# Patient Record
Sex: Female | Born: 1980 | Race: Black or African American | Hispanic: No | Marital: Single | Smoking: Never smoker
Health system: Southern US, Community
[De-identification: ages and names within clinical notes are randomized; demographics above are authoritative.]

## PROBLEM LIST (undated history)

## (undated) DIAGNOSIS — E079 Disorder of thyroid, unspecified: Secondary | ICD-10-CM

## (undated) DIAGNOSIS — K219 Gastro-esophageal reflux disease without esophagitis: Secondary | ICD-10-CM

## (undated) DIAGNOSIS — E0590 Thyrotoxicosis, unspecified without thyrotoxic crisis or storm: Secondary | ICD-10-CM

## (undated) HISTORY — PX: WISDOM TOOTH EXTRACTION: SHX21

---

## 1998-06-13 ENCOUNTER — Other Ambulatory Visit: Admission: RE | Admit: 1998-06-13 | Discharge: 1998-06-13 | Payer: Self-pay | Admitting: Obstetrics

## 2001-01-27 HISTORY — PX: TUBAL LIGATION: SHX77

## 2009-06-24 ENCOUNTER — Emergency Department (HOSPITAL_BASED_OUTPATIENT_CLINIC_OR_DEPARTMENT_OTHER): Admission: EM | Admit: 2009-06-24 | Discharge: 2009-06-24 | Payer: Self-pay | Admitting: Emergency Medicine

## 2009-06-24 ENCOUNTER — Ambulatory Visit: Payer: Self-pay | Admitting: Diagnostic Radiology

## 2012-12-17 ENCOUNTER — Emergency Department (HOSPITAL_BASED_OUTPATIENT_CLINIC_OR_DEPARTMENT_OTHER)
Admission: EM | Admit: 2012-12-17 | Discharge: 2012-12-17 | Disposition: A | Discharge status: Home / Self Care | Payer: Self-pay | Attending: Emergency Medicine | Admitting: Emergency Medicine

## 2012-12-17 ENCOUNTER — Encounter (HOSPITAL_BASED_OUTPATIENT_CLINIC_OR_DEPARTMENT_OTHER): Payer: Self-pay | Admitting: *Deleted

## 2012-12-17 DIAGNOSIS — Z3202 Encounter for pregnancy test, result negative: Secondary | ICD-10-CM | POA: Insufficient documentation

## 2012-12-17 DIAGNOSIS — Z8639 Personal history of other endocrine, nutritional and metabolic disease: Secondary | ICD-10-CM | POA: Insufficient documentation

## 2012-12-17 DIAGNOSIS — N760 Acute vaginitis: Secondary | ICD-10-CM | POA: Insufficient documentation

## 2012-12-17 DIAGNOSIS — Z862 Personal history of diseases of the blood and blood-forming organs and certain disorders involving the immune mechanism: Secondary | ICD-10-CM | POA: Insufficient documentation

## 2012-12-17 HISTORY — DX: Disorder of thyroid, unspecified: E07.9

## 2012-12-17 LAB — URINALYSIS, ROUTINE W REFLEX MICROSCOPIC
Ketones, ur: 15 mg/dL — AB
Leukocytes, UA: NEGATIVE
Nitrite: NEGATIVE
Protein, ur: NEGATIVE mg/dL

## 2012-12-17 LAB — WET PREP, GENITAL

## 2012-12-17 MED ORDER — CEFTRIAXONE SODIUM 250 MG IJ SOLR
250.0000 mg | Freq: Once | INTRAMUSCULAR | Status: AC
  Administered 2012-12-17: 250 mg via INTRAMUSCULAR
  Filled 2012-12-17: qty 250

## 2012-12-17 MED ORDER — LIDOCAINE HCL (PF) 1 % IJ SOLN
INTRAMUSCULAR | Status: AC
  Administered 2012-12-17: 2.1 mL
  Filled 2012-12-17: qty 5

## 2012-12-17 MED ORDER — METRONIDAZOLE 500 MG PO TABS: 500.0000 mg | ORAL_TABLET | Freq: Two times a day (BID) | ORAL | Status: DC

## 2012-12-17 MED ORDER — AZITHROMYCIN 250 MG PO TABS
1000.0000 mg | ORAL_TABLET | Freq: Once | ORAL | Status: AC
  Administered 2012-12-17: 1000 mg via ORAL
  Filled 2012-12-17: qty 4

## 2012-12-17 NOTE — ED Provider Notes (Signed)
History     CSN: [id number]  Arrival date & time 12/17/12  1417   First MD Initiated Contact with Patient 12/17/12 1548      Chief Complaint  Patient presents with  . Exposure to STD    (Consider location/radiation/quality/duration/timing/severity/associated sxs/prior treatment) HPI Comments: Patient presents today requesting to be checked for STD's.  She is sexually active.  Her boyfriend was recently diagnosed with Epididymitis and was treated for STD's.  She denies any symptoms at this time.  No vaginal discharge. No pelvic pain.  No fever or chills.   No prior history of STD's.    Patient is a 32 y.o. female presenting with STD exposure. The history is provided by the patient.  Exposure to STD This is a new problem. Pertinent negatives include no abdominal pain, chills, fever, nausea, rash, urinary symptoms or vomiting.    Past Medical History  Diagnosis Date  . Thyroid disease     Past Surgical History  Procedure Date  . Tubal ligation     History reviewed. No pertinent family history.  History  Substance Use Topics  . Smoking status: Never Smoker   . Smokeless tobacco: Not on file  . Alcohol Use: No    OB History    Grav Para Term Preterm Abortions TAB SAB Ect Mult Living                  Review of Systems  Constitutional: Negative for fever and chills.  Gastrointestinal: Negative for nausea, vomiting and abdominal pain.  Genitourinary: Negative for dysuria, urgency, frequency, vaginal bleeding and vaginal discharge.  Skin: Negative for rash.  All other systems reviewed and are negative.    Allergies  Milk-related compounds and Watermelon flavor  Home Medications  No current outpatient prescriptions on file.  BP 138/70  Pulse 80  Temp 98.1 F (36.7 C) (Oral)  Resp 20  Ht 5\' 5"  (1.651 m)  Wt 172 lb (78.019 kg)  BMI 28.62 kg/m2  SpO2 99%  LMP 11/23/2012  Physical Exam  Nursing note and vitals reviewed. Constitutional: She appears  well-developed and well-nourished. No distress.  HENT:  Head: Normocephalic and atraumatic.  Neck: Normal range of motion. Neck supple.  Cardiovascular: Normal rate, regular rhythm and normal heart sounds.   Pulmonary/Chest: Effort normal and breath sounds normal.  Abdominal: Soft. Bowel sounds are normal. She exhibits no distension and no mass. There is no tenderness. There is no rebound and no guarding.  Genitourinary: There is no rash, tenderness or lesion on the right labia. There is no rash, tenderness or lesion on the left labia. Cervix exhibits no motion tenderness. Right adnexum displays no mass, no tenderness and no fullness. Left adnexum displays no mass, no tenderness and no fullness.  Neurological: She is alert.  Skin: Skin is warm and dry. She is not diaphoretic.  Psychiatric: She has a normal mood and affect.    ED Course  Procedures (including critical care time)  Labs Reviewed  URINALYSIS, ROUTINE W REFLEX MICROSCOPIC - Abnormal; Notable for the following:    Bilirubin Urine SMALL (*)     Ketones, ur 15 (*)     Urobilinogen, UA 2.0 (*)     All other components within normal limits  PREGNANCY, URINE  GC/CHLAMYDIA PROBE AMP  WET PREP, GENITAL   No results found.   No diagnosis found.    MDM  Patient to be discharged with instructions to follow up with OBGYN. Discussed importance of using protection when sexually  active. Pt understands that they have GC/Chlamydia cultures pending and that they will need to inform all sexual partners if results return positive. Pt has been treated prophylacticly with azithromycin and rocephin due to pts history and at the patient's request. Pt not concerning for PID because hemodynamically stable and no cervical motion tenderness on pelvic exam. Pt has also been treated with flagyl for Bacterial Vaginosis. Pt has been advised to not drink alcohol while on this medication.         Pascal Lux [place], PA-C 12/17/12 2156

## 2012-12-17 NOTE — ED Notes (Signed)
Pt states she may have been exposed to an STD. Boyfriend being seen currently for ? Same. Pt has no s/s.

## 2012-12-20 ENCOUNTER — Emergency Department (HOSPITAL_BASED_OUTPATIENT_CLINIC_OR_DEPARTMENT_OTHER)
Admission: EM | Admit: 2012-12-20 | Discharge: 2012-12-20 | Disposition: A | Discharge status: Home / Self Care | Payer: Self-pay | Attending: Emergency Medicine | Admitting: Emergency Medicine

## 2012-12-20 ENCOUNTER — Encounter (HOSPITAL_BASED_OUTPATIENT_CLINIC_OR_DEPARTMENT_OTHER): Payer: Self-pay | Admitting: Emergency Medicine

## 2012-12-20 DIAGNOSIS — N390 Urinary tract infection, site not specified: Secondary | ICD-10-CM | POA: Insufficient documentation

## 2012-12-20 DIAGNOSIS — Z9851 Tubal ligation status: Secondary | ICD-10-CM | POA: Insufficient documentation

## 2012-12-20 DIAGNOSIS — Z79899 Other long term (current) drug therapy: Secondary | ICD-10-CM | POA: Insufficient documentation

## 2012-12-20 DIAGNOSIS — K5289 Other specified noninfective gastroenteritis and colitis: Secondary | ICD-10-CM | POA: Insufficient documentation

## 2012-12-20 DIAGNOSIS — Z3202 Encounter for pregnancy test, result negative: Secondary | ICD-10-CM | POA: Insufficient documentation

## 2012-12-20 DIAGNOSIS — K529 Noninfective gastroenteritis and colitis, unspecified: Secondary | ICD-10-CM

## 2012-12-20 DIAGNOSIS — E079 Disorder of thyroid, unspecified: Secondary | ICD-10-CM | POA: Insufficient documentation

## 2012-12-20 DIAGNOSIS — R197 Diarrhea, unspecified: Secondary | ICD-10-CM | POA: Insufficient documentation

## 2012-12-20 LAB — BASIC METABOLIC PANEL
CO2: 25 mEq/L (ref 19–32)
Chloride: 103 mEq/L (ref 96–112)
Creatinine, Ser: 0.5 mg/dL (ref 0.50–1.10)
GFR calc Af Amer: >90 mL/min (ref >90)
Potassium: 3.9 mEq/L (ref 3.5–5.1)

## 2012-12-20 LAB — CBC WITH DIFFERENTIAL/PLATELET
Basophils Absolute: 0 10*3/uL (ref 0.0–0.1)
Basophils Relative: 0 % (ref 0–1)
HCT: 42.2 % (ref 36.0–46.0)
Hemoglobin: 14.8 g/dL (ref 12.0–15.0)
Lymphocytes Relative: 8 % — ABNORMAL LOW (ref 12–46)
Monocytes Absolute: 0.8 10*3/uL (ref 0.1–1.0)
Monocytes Relative: 9 % (ref 3–12)
Neutro Abs: 8.1 10*3/uL — ABNORMAL HIGH (ref 1.7–7.7)
Neutrophils Relative %: 83 % — ABNORMAL HIGH (ref 43–77)
RDW: 14 % (ref 11.5–15.5)
WBC: 9.8 10*3/uL (ref 4.0–10.5)

## 2012-12-20 LAB — URINE MICROSCOPIC-ADD ON

## 2012-12-20 LAB — URINALYSIS, ROUTINE W REFLEX MICROSCOPIC
Glucose, UA: NEGATIVE mg/dL
Ketones, ur: >80 mg/dL — AB
Leukocytes, UA: NEGATIVE
Nitrite: NEGATIVE
Specific Gravity, Urine: 1.03 (ref 1.005–1.030)
pH: 6 (ref 5.0–8.0)

## 2012-12-20 LAB — PREGNANCY, URINE: Preg Test, Ur: NEGATIVE

## 2012-12-20 MED ORDER — SODIUM CHLORIDE 0.9 % IV BOLUS (SEPSIS)
1000.0000 mL | Freq: Once | INTRAVENOUS | Status: AC
  Administered 2012-12-20: 1000 mL via INTRAVENOUS

## 2012-12-20 MED ORDER — DEXTROSE 5 % IV SOLN
1.0000 g | INTRAVENOUS | Status: DC
  Administered 2012-12-20: 1 g via INTRAVENOUS
  Filled 2012-12-20: qty 10

## 2012-12-20 MED ORDER — METOCLOPRAMIDE HCL 10 MG PO TABS: 10.0000 mg | ORAL_TABLET | Freq: Four times a day (QID) | ORAL | Status: DC | PRN

## 2012-12-20 MED ORDER — ONDANSETRON HCL 4 MG/2ML IJ SOLN
4.0000 mg | Freq: Once | INTRAMUSCULAR | Status: AC
  Administered 2012-12-20: 4 mg via INTRAVENOUS
  Filled 2012-12-20: qty 2

## 2012-12-20 MED ORDER — CEPHALEXIN 500 MG PO CAPS: 500.0000 mg | ORAL_CAPSULE | Freq: Four times a day (QID) | ORAL | Status: DC

## 2012-12-20 MED ORDER — LOPERAMIDE HCL 2 MG PO CAPS
4.0000 mg | ORAL_CAPSULE | Freq: Once | ORAL | Status: AC
  Administered 2012-12-20: 4 mg via ORAL
  Filled 2012-12-20: qty 2

## 2012-12-20 NOTE — ED Notes (Signed)
N/V x "20" since last night.  NP cough.  ? Fever.

## 2012-12-20 NOTE — ED Provider Notes (Signed)
History     CSN: [id number]  Arrival date & time 12/20/12  4098   First MD Initiated Contact with Patient 12/20/12 [phone]      Chief Complaint  Patient presents with  . Emesis  . Diarrhea    (Consider location/radiation/quality/duration/timing/severity/associated sxs/prior treatment) Patient is a 32 y.o. female presenting with vomiting and diarrhea. The history is provided by the patient.  Emesis  Associated symptoms include diarrhea.  Diarrhea The primary symptoms include vomiting and diarrhea.  She had onset last night of nausea, vomiting, diarrhea. Nausea comes in waves. She has chills and sweats when she vomits, but not at other times. She denies fever. She does undergo she's had any sick contacts. She estimates that she is vomited 20 times. She continues to have nausea and a sense of impending diarrhea.  Past Medical History  Diagnosis Date  . Thyroid disease     Past Surgical History  Procedure Date  . Tubal ligation     No family history on file.  History  Substance Use Topics  . Smoking status: Never Smoker   . Smokeless tobacco: Not on file  . Alcohol Use: No    OB History    Grav Para Term Preterm Abortions TAB SAB Ect Mult Living                  Review of Systems  Gastrointestinal: Positive for vomiting and diarrhea.  All other systems reviewed and are negative.    Allergies  Milk-related compounds and Watermelon flavor  Home Medications   Current Outpatient Rx  Name  Route  Sig  Dispense  Refill  . METRONIDAZOLE 500 MG PO TABS   Oral   Take 1 tablet (500 mg total) by mouth 2 (two) times daily.   14 tablet   0     BP 139/84  Pulse 85  Temp 98.1 F (36.7 C) (Oral)  Resp 16  Ht 5\' 1"  (1.549 m)  Wt 172 lb (78.019 kg)  BMI 32.50 kg/m2  SpO2 100%  LMP 11/29/2012  Physical Exam  Nursing note and vitals reviewed. 32 year old female, resting comfortably and in no acute distress. Vital signs are significant for borderline hypertension  with blood pressure 139/84. Oxygen saturation is 100%, which is normal. Head is normocephalic and atraumatic. PERRLA, EOMI. Oropharynx is clear. Neck is nontender and supple without adenopathy or JVD. Back is nontender and there is no CVA tenderness. Lungs are clear without rales, wheezes, or rhonchi. Chest is nontender. Heart has regular rate and rhythm without murmur. Abdomen is soft, flat, nontender without masses or hepatosplenomegaly and peristalsis is hypoactive. Extremities have no cyanosis or edema, full range of motion is present. Skin is warm and dry without rash. Neurologic: Mental status is normal, cranial nerves are intact, there are no motor or sensory deficits.   ED Course  Procedures (including critical care time)  Results for orders placed during the hospital encounter of 12/20/12  CBC WITH DIFFERENTIAL      Component Value Range   WBC 9.8  4.0 - 10.5 K/uL   RBC 5.45 (*) 3.87 - 5.11 MIL/uL   Hemoglobin 14.8  12.0 - 15.0 g/dL   HCT 47.8  29.5 - 62.1 %   MCV 77.4 (*) 78.0 - 100.0 fL   MCH 27.2  26.0 - 34.0 pg   MCHC 35.1  30.0 - 36.0 g/dL   RDW 30.8  65.7 - 84.6 %   Platelets 266  150 - 400 K/uL  Neutrophils Relative 83 (*) 43 - 77 %   Neutro Abs 8.1 (*) 1.7 - 7.7 K/uL   Lymphocytes Relative 8 (*) 12 - 46 %   Lymphs Abs 0.8  0.7 - 4.0 K/uL   Monocytes Relative 9  3 - 12 %   Monocytes Absolute 0.8  0.1 - 1.0 K/uL   Eosinophils Relative 0  0 - 5 %   Eosinophils Absolute 0.0  0.0 - 0.7 K/uL   Basophils Relative 0  0 - 1 %   Basophils Absolute 0.0  0.0 - 0.1 K/uL  BASIC METABOLIC PANEL      Component Value Range   Sodium 141  135 - 145 mEq/L   Potassium 3.9  3.5 - 5.1 mEq/L   Chloride 103  96 - 112 mEq/L   CO2 25  19 - 32 mEq/L   Glucose, Bld 132 (*) 70 - 99 mg/dL   BUN 17  6 - 23 mg/dL   Creatinine, Ser 4.09  0.50 - 1.10 mg/dL   Calcium 81.1  8.4 - 91.4 mg/dL   GFR calc non Af Amer >90  >90 mL/min   GFR calc Af Amer >90  >90 mL/min  URINALYSIS, ROUTINE W  REFLEX MICROSCOPIC      Component Value Range   Color, Urine AMBER (*) YELLOW   APPearance CLOUDY (*) CLEAR   Specific Gravity, Urine 1.030  1.005 - 1.030   pH 6.0  5.0 - 8.0   Glucose, UA NEGATIVE  NEGATIVE mg/dL   Hgb urine dipstick SMALL (*) NEGATIVE   Bilirubin Urine SMALL (*) NEGATIVE   Ketones, ur >80 (*) NEGATIVE mg/dL   Protein, ur 30 (*) NEGATIVE mg/dL   Urobilinogen, UA 1.0  0.0 - 1.0 mg/dL   Nitrite NEGATIVE  NEGATIVE   Leukocytes, UA NEGATIVE  NEGATIVE  PREGNANCY, URINE      Component Value Range   Preg Test, Ur NEGATIVE  NEGATIVE  URINE MICROSCOPIC-ADD ON      Component Value Range   Squamous Epithelial / LPF FEW (*) RARE   WBC, UA 0-2  <3 WBC/hpf   RBC / HPF 3-6  <3 RBC/hpf   Bacteria, UA MANY (*) RARE   Urine-Other MUCOUS PRESENT      1. Gastroenteritis   2. Urinary tract infection       MDM  Probable viral gastroenteritis she does show signs of dehydration he'll be given IV fluids. She'll be given ondansetron for nausea and loperamide for diarrhea.   She feels significantly better after above noted treatment. Urinalysis is significant for bacteria. She's given a dose of ceftriaxone and will be discharged with prescriptions for ondansetron and cephalexin and is told to use over-the-counter loperamide as needed for diarrhea.        Dione Booze, MD 12/20/12 [phone]

## 2012-12-21 LAB — URINE CULTURE
Colony Count: NO GROWTH
Culture: NO GROWTH

## 2012-12-21 NOTE — ED Provider Notes (Signed)
Medical screening examination/treatment/procedure(s) were performed by non-physician practitioner and as supervising physician I was immediately available for consultation/collaboration.   Rolan Bucco, MD 12/21/12 [phone]

## 2012-12-31 ENCOUNTER — Emergency Department (HOSPITAL_BASED_OUTPATIENT_CLINIC_OR_DEPARTMENT_OTHER)
Admission: EM | Admit: 2012-12-31 | Discharge: 2012-12-31 | Disposition: A | Discharge status: Home / Self Care | Payer: Self-pay | Attending: Emergency Medicine | Admitting: Emergency Medicine

## 2012-12-31 ENCOUNTER — Encounter (HOSPITAL_BASED_OUTPATIENT_CLINIC_OR_DEPARTMENT_OTHER): Payer: Self-pay

## 2012-12-31 DIAGNOSIS — Y9389 Activity, other specified: Secondary | ICD-10-CM | POA: Insufficient documentation

## 2012-12-31 DIAGNOSIS — Y9289 Other specified places as the place of occurrence of the external cause: Secondary | ICD-10-CM | POA: Insufficient documentation

## 2012-12-31 DIAGNOSIS — S39012A Strain of muscle, fascia and tendon of lower back, initial encounter: Secondary | ICD-10-CM

## 2012-12-31 DIAGNOSIS — Z79899 Other long term (current) drug therapy: Secondary | ICD-10-CM | POA: Insufficient documentation

## 2012-12-31 DIAGNOSIS — Z862 Personal history of diseases of the blood and blood-forming organs and certain disorders involving the immune mechanism: Secondary | ICD-10-CM | POA: Insufficient documentation

## 2012-12-31 DIAGNOSIS — S335XXA Sprain of ligaments of lumbar spine, initial encounter: Secondary | ICD-10-CM | POA: Insufficient documentation

## 2012-12-31 DIAGNOSIS — Z8639 Personal history of other endocrine, nutritional and metabolic disease: Secondary | ICD-10-CM | POA: Insufficient documentation

## 2012-12-31 MED ORDER — METHOCARBAMOL 500 MG PO TABS: 500.0000 mg | ORAL_TABLET | Freq: Two times a day (BID) | ORAL | Status: DC

## 2012-12-31 MED ORDER — TRAMADOL HCL 50 MG PO TABS
50.0000 mg | ORAL_TABLET | Freq: Once | ORAL | Status: AC
  Administered 2012-12-31: 50 mg via ORAL
  Filled 2012-12-31: qty 1

## 2012-12-31 MED ORDER — TRAMADOL HCL 50 MG PO TABS: 50.0000 mg | ORAL_TABLET | Freq: Four times a day (QID) | ORAL | Status: DC | PRN

## 2012-12-31 MED ORDER — IBUPROFEN 400 MG PO TABS
600.0000 mg | ORAL_TABLET | Freq: Once | ORAL | Status: AC
  Administered 2012-12-31: 600 mg via ORAL
  Filled 2012-12-31: qty 1

## 2012-12-31 MED ORDER — METHOCARBAMOL 500 MG PO TABS
500.0000 mg | ORAL_TABLET | Freq: Once | ORAL | Status: AC
  Administered 2012-12-31: 500 mg via ORAL
  Filled 2012-12-31: qty 1

## 2012-12-31 MED ORDER — IBUPROFEN 600 MG PO TABS: 600.0000 mg | ORAL_TABLET | Freq: Four times a day (QID) | ORAL | Status: DC | PRN

## 2012-12-31 NOTE — ED Notes (Signed)
MD at bedside. 
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
## 2013-03-25 ENCOUNTER — Emergency Department (HOSPITAL_BASED_OUTPATIENT_CLINIC_OR_DEPARTMENT_OTHER): Payer: Self-pay

## 2013-03-25 ENCOUNTER — Emergency Department (HOSPITAL_BASED_OUTPATIENT_CLINIC_OR_DEPARTMENT_OTHER)
Admission: EM | Admit: 2013-03-25 | Discharge: 2013-03-25 | Disposition: A | Discharge status: Home / Self Care | Payer: Self-pay | Attending: Emergency Medicine | Admitting: Emergency Medicine

## 2013-03-25 ENCOUNTER — Emergency Department (HOSPITAL_BASED_OUTPATIENT_CLINIC_OR_DEPARTMENT_OTHER): Admission: EM | Admit: 2013-03-25 | Discharge: 2013-03-25 | Discharge status: Against Medical Advice | Payer: Self-pay

## 2013-03-25 ENCOUNTER — Encounter (HOSPITAL_BASED_OUTPATIENT_CLINIC_OR_DEPARTMENT_OTHER): Payer: Self-pay | Admitting: *Deleted

## 2013-03-25 DIAGNOSIS — Z862 Personal history of diseases of the blood and blood-forming organs and certain disorders involving the immune mechanism: Secondary | ICD-10-CM | POA: Insufficient documentation

## 2013-03-25 DIAGNOSIS — R51 Headache: Secondary | ICD-10-CM | POA: Insufficient documentation

## 2013-03-25 DIAGNOSIS — Z3202 Encounter for pregnancy test, result negative: Secondary | ICD-10-CM | POA: Insufficient documentation

## 2013-03-25 DIAGNOSIS — Z8639 Personal history of other endocrine, nutritional and metabolic disease: Secondary | ICD-10-CM | POA: Insufficient documentation

## 2013-03-25 DIAGNOSIS — R110 Nausea: Secondary | ICD-10-CM | POA: Insufficient documentation

## 2013-03-25 LAB — URINALYSIS, ROUTINE W REFLEX MICROSCOPIC
Hgb urine dipstick: NEGATIVE
Nitrite: NEGATIVE
Protein, ur: NEGATIVE mg/dL
Specific Gravity, Urine: 1.025 (ref 1.005–1.030)
Urobilinogen, UA: 1 mg/dL (ref 0.0–1.0)

## 2013-03-25 LAB — CBC
Hemoglobin: 12 g/dL (ref 12.0–15.0)
RBC: 4.5 MIL/uL (ref 3.87–5.11)

## 2013-03-25 LAB — BASIC METABOLIC PANEL
CO2: 21 mEq/L (ref 19–32)
Glucose, Bld: 99 mg/dL (ref 70–99)
Potassium: 4.3 mEq/L (ref 3.5–5.1)
Sodium: 138 mEq/L (ref 135–145)

## 2013-03-25 LAB — PREGNANCY, URINE: Preg Test, Ur: NEGATIVE

## 2013-03-25 MED ORDER — SODIUM CHLORIDE 0.9 % IV BOLUS (SEPSIS)
1000.0000 mL | Freq: Once | INTRAVENOUS | Status: AC
  Administered 2013-03-25: 1000 mL via INTRAVENOUS

## 2013-03-25 MED ORDER — KETOROLAC TROMETHAMINE 30 MG/ML IJ SOLN
30.0000 mg | Freq: Once | INTRAMUSCULAR | Status: AC
  Administered 2013-03-25: 30 mg via INTRAVENOUS
  Filled 2013-03-25: qty 1

## 2013-03-25 MED ORDER — METOCLOPRAMIDE HCL 5 MG/ML IJ SOLN
10.0000 mg | Freq: Once | INTRAMUSCULAR | Status: AC
  Administered 2013-03-25: 10 mg via INTRAVENOUS
  Filled 2013-03-25: qty 2

## 2013-03-25 MED ORDER — ONDANSETRON HCL 4 MG PO TABS: 4.0000 mg | ORAL_TABLET | Freq: Four times a day (QID) | ORAL | Status: DC

## 2013-03-25 MED ORDER — DIPHENHYDRAMINE HCL 50 MG/ML IJ SOLN
25.0000 mg | Freq: Once | INTRAMUSCULAR | Status: AC
  Administered 2013-03-25: 25 mg via INTRAVENOUS
  Filled 2013-03-25: qty 1

## 2013-03-25 NOTE — ED Provider Notes (Signed)
History  This chart was scribed for Cassandra Chick, MD by Shari Heritage, ED Scribe. The patient was seen in room MH06/MH06. Patient's care was started at 1506.   CSN: [id number]  Arrival date & time 03/25/13  1425   First MD Initiated Contact with Patient 03/25/13 1506      Chief Complaint  Patient presents with  . Headache    Patient is a 32 y.o. female presenting with headaches. The history is provided by the patient. No language interpreter was used.  Headache Pain location:  Generalized Quality:  Unable to specify Radiates to:  Does not radiate Severity currently:  9/10 Severity at highest:  9/10 Onset quality:  Gradual Duration:  2 days Timing:  Constant Chronicity:  New Relieved by:  Nothing Ineffective treatments:  NSAIDs Associated symptoms: nausea   Associated symptoms: no abdominal pain, no back pain, no blurred vision, no congestion, no cough, no diarrhea, no dizziness, no fever, no hearing loss, no loss of balance, no neck pain, no neck stiffness, no numbness, no seizures, no tingling, no visual change, no vomiting and no weakness      HPI Comments: Cassandra Ellis is a 32 y.o. female who presents to the Emergency Department complaining of non-radiating, diffuse, moderate to severe, constant headache onset 2 days ago. There is associated nausea. Patient states that headache is worse while standing and sitting up. She rates headache as 9/10 currently. She denies leg swelling, chest pain, visual changes, shortness of breath, vomiting, fever, chills, neurological deficits or any other symptoms. She has taken OTC medicines at home with no relief. She denies history of headaches or migraines. There have been no recent long car or plane trips. Patient has no history of cardiac disease, diabetes or hypertension. There is no personal history of DVT/PE.   Past Medical History  Diagnosis Date  . Thyroid disease     Past Surgical History  Procedure Laterality Date  . Tubal  ligation      History reviewed. No pertinent family history.  History  Substance Use Topics  . Smoking status: Never Smoker   . Smokeless tobacco: Not on file  . Alcohol Use: No    OB History   Grav Para Term Preterm Abortions TAB SAB Ect Mult Living                  Review of Systems  Constitutional: Negative for fever.  HENT: Negative for hearing loss, congestion, neck pain and neck stiffness.   Eyes: Negative for blurred vision.  Respiratory: Negative for cough.   Gastrointestinal: Positive for nausea. Negative for vomiting, abdominal pain and diarrhea.  Musculoskeletal: Negative for back pain.  Neurological: Positive for headaches. Negative for dizziness, seizures, numbness and loss of balance.  All other systems reviewed and are negative.    Allergies  Milk-related compounds and Watermelon flavor  Home Medications   Current Outpatient Rx  Name  Route  Sig  Dispense  Refill  . cephALEXin (KEFLEX) 500 MG capsule   Oral   Take 1 capsule (500 mg total) by mouth 4 (four) times daily.   40 capsule   0   . ibuprofen (ADVIL,MOTRIN) 600 MG tablet   Oral   Take 1 tablet (600 mg total) by mouth every 6 (six) hours as needed for pain.   30 tablet   0   . methocarbamol (ROBAXIN) 500 MG tablet   Oral   Take 1 tablet (500 mg total) by mouth 2 (two) times daily.  20 tablet   0   . metoCLOPramide (REGLAN) 10 MG tablet   Oral   Take 1 tablet (10 mg total) by mouth every 6 (six) hours as needed (nausea).   30 tablet   0   . metroNIDAZOLE (FLAGYL) 500 MG tablet   Oral   Take 1 tablet (500 mg total) by mouth 2 (two) times daily.   14 tablet   0   . traMADol (ULTRAM) 50 MG tablet   Oral   Take 1 tablet (50 mg total) by mouth every 6 (six) hours as needed for pain.   15 tablet   0     Triage Vitals: BP 134/70  Pulse 90  Temp(Src) 98.4 F (36.9 C) (Oral)  Resp 18  Ht 5\' 1"  (1.549 m)  Wt 170 lb (77.111 kg)  BMI 32.14 kg/m2  SpO2 100%  LMP  02/22/2013  Physical Exam  Constitutional: She is oriented to person, place, and time. She appears well-developed and well-nourished. No distress.  Uncomfortable appearing.  HENT:  Head: Normocephalic and atraumatic.  Mouth/Throat: Oropharynx is clear and moist and mucous membranes are normal. Mucous membranes are not dry.  Eyes: Conjunctivae and EOM are normal. Pupils are equal, round, and reactive to light.  Neck: Normal range of motion. Neck supple.  No meningismus. No nuchal rigidity.  Cardiovascular: Normal rate, regular rhythm and normal heart sounds.   Pulmonary/Chest: Effort normal and breath sounds normal.  Musculoskeletal: Normal range of motion.  Neurological: She is alert and oriented to person, place, and time.  5/5 strength throughout. CN 2-12 tested and intact. Normal grip strength. Sensation intact.  Skin: Skin is warm and dry. No rash noted. She is not diaphoretic.    ED Course  Procedures (including critical care time) DIAGNOSTIC STUDIES: Oxygen Saturation is 100% on room air, normal by my interpretation.    COORDINATION OF CARE: 3:10 PM- Patient presents with 2-day history of diffuse headache with associated nausea. Denies prior history of headaches or migraines. No neurological deficits on exam. Will order head CT. Will give Reglan 10 mg, Benadryl 25 mg, Toradol 30 mg and 1000 ml IV fluids. Patient informed of current plan for treatment and evaluation and agrees with plan at this time.    Labs Reviewed  URINALYSIS, ROUTINE W REFLEX MICROSCOPIC - Abnormal; Notable for the following:    APPearance CLOUDY (*)    Ketones, ur >80 (*)    All other components within normal limits  CBC - Abnormal; Notable for the following:    HCT 34.2 (*)    MCV 76.0 (*)    All other components within normal limits  BASIC METABOLIC PANEL - Abnormal; Notable for the following:    Creatinine, Ser 0.40 (*)    All other components within normal limits  PREGNANCY, URINE    Ct Head  Wo Contrast  03/25/2013  *RADIOLOGY REPORT*  Clinical Data: Headaches.  CT HEAD WITHOUT CONTRAST  Technique:  Contiguous axial images were obtained from the base of the skull through the vertex without contrast.  Comparison: None.  Findings: There are small periventricular white matter lucencies in both frontal lobes, more on the right than the left.  There is no hemorrhage or acute infarction or mass lesion.  No atrophy. Osseous structures are normal.  IMPRESSION: Small nonspecific white matter lucencies in the frontal lobes. These may represent small vessel ischemic changes.  Does the patient have a history of migraines?  Does the patient have fever or other signs of  infection?   Original Report Authenticated By: Francene Boyers, M.D.      1. Headache       MDM  Pt presenting with c/o headache.  She has no fever, no meningismus, normal neurologic exam.  Headache relieved after meds in the ED.  Nonspecific CT scan findings in frontal lobes- these findings were discussed with the patient.  She is scheduled for MRI tomorrow to further evaluation.  Very low suspicion for meningitis or encephalitis during today's visit.       I personally performed the services described in this documentation, which was scribed in my presence. The recorded information has been reviewed and is accurate.    Cassandra Chick, MD 03/28/13 [phone]

## 2013-03-25 NOTE — ED Notes (Signed)
Informed RN and EMT Tech's at 1555  I need a urinalysis for room 6 before Cat Scan.

## 2013-03-25 NOTE — ED Notes (Signed)
Pt here with boyfriend that is also being seen. C/O H/A x 2 days. +nausea. Increased pressure when standing.

## 2013-03-27 ENCOUNTER — Ambulatory Visit (HOSPITAL_BASED_OUTPATIENT_CLINIC_OR_DEPARTMENT_OTHER)
Admission: RE | Admit: 2013-03-27 | Discharge: 2013-03-27 | Disposition: A | Discharge status: Home / Self Care | Payer: BC Managed Care – PPO | Source: Ambulatory Visit | Attending: Emergency Medicine | Admitting: Emergency Medicine

## 2013-03-27 DIAGNOSIS — R948 Abnormal results of function studies of other organs and systems: Secondary | ICD-10-CM | POA: Insufficient documentation

## 2013-03-27 DIAGNOSIS — R51 Headache: Secondary | ICD-10-CM | POA: Insufficient documentation

## 2013-03-27 MED ORDER — GADOBENATE DIMEGLUMINE 529 MG/ML IV SOLN
17.0000 mL | Freq: Once | INTRAVENOUS | Status: AC | PRN
  Administered 2013-03-27: 17 mL via INTRAVENOUS

## 2013-03-28 ENCOUNTER — Encounter (HOSPITAL_COMMUNITY): Payer: Self-pay

## 2013-03-28 ENCOUNTER — Observation Stay (HOSPITAL_COMMUNITY)
Admission: AD | Admit: 2013-03-28 | Discharge: 2013-03-30 | Disposition: A | Discharge status: Home / Self Care | Payer: BC Managed Care – PPO | Source: Ambulatory Visit | Attending: Internal Medicine | Admitting: Internal Medicine

## 2013-03-28 DIAGNOSIS — R7989 Other specified abnormal findings of blood chemistry: Secondary | ICD-10-CM

## 2013-03-28 DIAGNOSIS — E876 Hypokalemia: Secondary | ICD-10-CM | POA: Diagnosis present

## 2013-03-28 DIAGNOSIS — E0590 Thyrotoxicosis, unspecified without thyrotoxic crisis or storm: Secondary | ICD-10-CM

## 2013-03-28 DIAGNOSIS — R51 Headache: Principal | ICD-10-CM | POA: Insufficient documentation

## 2013-03-28 DIAGNOSIS — Z79899 Other long term (current) drug therapy: Secondary | ICD-10-CM | POA: Insufficient documentation

## 2013-03-28 DIAGNOSIS — H53459 Other localized visual field defect, unspecified eye: Secondary | ICD-10-CM | POA: Insufficient documentation

## 2013-03-28 DIAGNOSIS — R519 Headache, unspecified: Secondary | ICD-10-CM | POA: Diagnosis present

## 2013-03-28 DIAGNOSIS — R112 Nausea with vomiting, unspecified: Secondary | ICD-10-CM | POA: Diagnosis present

## 2013-03-28 LAB — CBC WITH DIFFERENTIAL/PLATELET
Basophils Relative: 0 % (ref 0–1)
Eosinophils Absolute: 0 10*3/uL (ref 0.0–0.7)
HCT: 38.9 % (ref 36.0–46.0)
Hemoglobin: 13.5 g/dL (ref 12.0–15.0)
MCH: 26.1 pg (ref 26.0–34.0)
MCHC: 34.7 g/dL (ref 30.0–36.0)
Monocytes Absolute: 0.7 10*3/uL (ref 0.1–1.0)
Monocytes Relative: 10 % (ref 3–12)

## 2013-03-28 LAB — COMPREHENSIVE METABOLIC PANEL
Albumin: 3.8 g/dL (ref 3.5–5.2)
Alkaline Phosphatase: 94 U/L (ref 39–117)
BUN: 14 mg/dL (ref 6–23)
Chloride: 99 mEq/L (ref 96–112)
GFR calc Af Amer: >90 mL/min (ref >90)
Glucose, Bld: 104 mg/dL — ABNORMAL HIGH (ref 70–99)
Potassium: 3.3 mEq/L — ABNORMAL LOW (ref 3.5–5.1)
Total Bilirubin: 0.9 mg/dL (ref 0.3–1.2)

## 2013-03-28 LAB — MAGNESIUM: Magnesium: 1.7 mg/dL (ref 1.5–2.5)

## 2013-03-28 LAB — PROTIME-INR: INR: 1.14 (ref 0.00–1.49)

## 2013-03-28 MED ORDER — TRAMADOL HCL 50 MG PO TABS: 50.0000 mg | ORAL_TABLET | Freq: Four times a day (QID) | ORAL | Status: DC | PRN

## 2013-03-28 MED ORDER — HYDROCODONE-ACETAMINOPHEN 5-325 MG PO TABS
1.0000 | ORAL_TABLET | ORAL | Status: DC | PRN
  Administered 2013-03-28: 2 via ORAL
  Filled 2013-03-28: qty 2

## 2013-03-28 MED ORDER — METOCLOPRAMIDE HCL 10 MG PO TABS: 10.0000 mg | ORAL_TABLET | Freq: Four times a day (QID) | ORAL | Status: DC | PRN

## 2013-03-28 MED ORDER — ONDANSETRON HCL 4 MG PO TABS
4.0000 mg | ORAL_TABLET | Freq: Four times a day (QID) | ORAL | Status: DC | PRN
  Administered 2013-03-28: 4 mg via ORAL
  Filled 2013-03-28: qty 1

## 2013-03-28 MED ORDER — ACETAMINOPHEN 325 MG PO TABS: 650.0000 mg | ORAL_TABLET | Freq: Four times a day (QID) | ORAL | Status: DC | PRN

## 2013-03-28 MED ORDER — ONDANSETRON HCL 4 MG/2ML IJ SOLN: 4.0000 mg | Freq: Four times a day (QID) | INTRAMUSCULAR | Status: DC | PRN

## 2013-03-28 MED ORDER — METHOCARBAMOL 500 MG PO TABS
500.0000 mg | ORAL_TABLET | Freq: Four times a day (QID) | ORAL | Status: DC | PRN
  Filled 2013-03-28: qty 1

## 2013-03-28 MED ORDER — MORPHINE SULFATE 2 MG/ML IJ SOLN
1.0000 mg | INTRAMUSCULAR | Status: DC | PRN
  Administered 2013-03-29: 1 mg via INTRAVENOUS
  Filled 2013-03-28: qty 1

## 2013-03-28 MED ORDER — ACETAMINOPHEN 650 MG RE SUPP: 650.0000 mg | Freq: Four times a day (QID) | RECTAL | Status: DC | PRN

## 2013-03-28 MED ORDER — DIPHENHYDRAMINE HCL 50 MG/ML IJ SOLN: 25.0000 mg | Freq: Four times a day (QID) | INTRAMUSCULAR | Status: DC | PRN

## 2013-03-28 MED ORDER — PROCHLORPERAZINE EDISYLATE 5 MG/ML IJ SOLN
10.0000 mg | Freq: Four times a day (QID) | INTRAMUSCULAR | Status: DC | PRN
  Filled 2013-03-28: qty 2

## 2013-03-28 MED ORDER — METHOCARBAMOL 500 MG PO TABS: 500.0000 mg | ORAL_TABLET | Freq: Two times a day (BID) | ORAL | Status: DC

## 2013-03-28 MED ORDER — SODIUM CHLORIDE 0.9 % IV SOLN
INTRAVENOUS | Status: DC
  Administered 2013-03-28: 75 mL/h via INTRAVENOUS

## 2013-03-28 NOTE — Progress Notes (Signed)
Rizatriptan taken by patient with motrin and Zofran and got relief from headache, patient c/o of pain at this time

## 2013-03-28 NOTE — H&P (Signed)
[facility] History and Physical  Cassandra Ellis WUJ:[id number] DOB: 05-24-1981 DOA: 03/28/2013  Referring physician: ED physician PCP: Jackie Plum, MD   Chief Complaint: Headache  HPI:  Pt is 32 yo female who presented to ED with main concern of progressively worsening generalized headache that initially started 2 days prior to admission, 10/10 in severity, constant, non radiating, associated with nausea and non bloody vomiting, not relieved with OTC medications, no specific aggravating factors. Pt denies similar events in the past, no history of migraines or other types of headaches, no visual changes, no fevers, chills, no neck stiffness. Pt also denies other associated symptoms. Pt denies chest pain or shortness of breath.   In ED, pt given morphine and tylenol, says only minimal relief. [facility] asked to admit for further evaluation.   Assessment and Plan:  Principal Problem:   Generalized headache - unclear etiology as pt has no similar events in the past, MRI of the brain with no specific acute findings to explain headache - differential is wide as noted on MRI below, LP may be needed but will see with neurologist  - spoke with neurologist on call, recommendation is to avoid tylenol, Vicodin, initiate Compazine and Benadryl IV Q6 hours recommended - neurology will see in consultation, non emergent consult  - will check UDS, TSH Active Problems:   Nausea and vomiting - possibly associated and related to headaches, will provide Zofran for symptomatic relief of symptoms   Hypokalemia - secondary to vomiting - will supplement, BMP in AM  Code Status: Full Family Communication: Pt and family at bedside Disposition Plan: admit to medical floor   Review of Systems:  Constitutional: Negative for fever, chills and malaise/fatigue. Negative for diaphoresis.  HENT: Negative for hearing loss, ear pain, nosebleeds, congestion, sore throat, neck pain, tinnitus and ear discharge.    Eyes: Negative for blurred vision, double vision, photophobia, pain, discharge and redness.  Respiratory: Negative for cough, hemoptysis, sputum production, shortness of breath, wheezing and stridor.   Cardiovascular: Negative for chest pain, palpitations, orthopnea, claudication and leg swelling.  Gastrointestinal: Negative for abdominal pain. Negative for heartburn, constipation, blood in stool and melena.  Genitourinary: Negative for dysuria, urgency, frequency, hematuria and flank pain.  Musculoskeletal: Negative for myalgias, back pain, joint pain and falls.  Skin: Negative for itching and rash.  Neurological: Negative for tingling, tremors, sensory change, speech change, focal weakness, loss of consciousness. Endo/Heme/Allergies: Negative for environmental allergies and polydipsia. Does not bruise/bleed easily.  Psychiatric/Behavioral: Negative for suicidal ideas. The patient is not nervous/anxious.     Past Medical History  Diagnosis Date  . Thyroid disease     Past Surgical History  Procedure Laterality Date  . Tubal ligation     Social History:  reports that she has never smoked. She has never used smokeless tobacco. She reports that she does not drink alcohol or use illicit drugs.  Allergies  Allergen Reactions  . Milk-Related Compounds Other (See Comments)    Abd cramping and gas  . Watermelon Flavor Hives    Family history of HTN  Prior to Admission medications   Medication Sig Start Date End Date Taking? Authorizing Provider  ondansetron (ZOFRAN) 4 MG tablet Take 1 tablet (4 mg total) by mouth every 6 (six) hours. 03/25/13  Yes Ethelda Chick, MD    Physical Exam: Filed Vitals:   03/28/13 1454  BP: 129/80  Pulse: 84  Temp: 98 F (36.7 C)  TempSrc: Oral  Resp: 18  Height: 5\' 1"  (1.549  m)  Weight: 71.079 kg (156 lb 11.2 oz)  SpO2: 95%    Physical Exam  Constitutional: Appears well-developed and well-nourished. No distress.  HENT: Normocephalic.  External right and left ear normal. Oropharynx is clear and moist.  Eyes: Conjunctivae and EOM are normal. PERRLA, no scleral icterus.  Neck: Normal ROM. Neck supple. No JVD. No tracheal deviation. No thyromegaly.  CVS: RRR, S1/S2 +, no murmurs, no gallops, no carotid bruit.  Pulmonary: Effort and breath sounds normal, no stridor, rhonchi, wheezes, rales.  Abdominal: Soft. BS +,  no distension, tenderness, rebound or guarding.  Musculoskeletal: Normal range of motion. No edema and no tenderness.  Lymphadenopathy: No lymphadenopathy noted, cervical, inguinal. Neuro: Alert. Normal reflexes, muscle tone coordination. No cranial nerve deficit. Skin: Skin is warm and dry. No rash noted. Not diaphoretic. No erythema. No pallor.  Psychiatric: Normal mood and affect. Behavior, judgment, thought content normal.   Labs on Admission:  Basic Metabolic Panel:  Recent Labs Lab 03/25/13 1741 03/28/13 1512  NA 138 137  K 4.3 3.3*  CL 107 99  CO2 21 21  GLUCOSE 99 104*  BUN 10 14  CREATININE 0.40* 0.34*  CALCIUM 9.0 10.1  MG  --  1.7  PHOS  --  3.1   Liver Function Tests:  Recent Labs Lab 03/28/13 1512  AST 16  ALT 25  ALKPHOS 94  BILITOT 0.9  PROT 7.3  ALBUMIN 3.8   CBC:  Recent Labs Lab 03/25/13 1741 03/28/13 1512  WBC 6.8 7.4  NEUTROABS  --  5.0  HGB 12.0 13.5  HCT 34.2* 38.9  MCV 76.0* 75.2*  PLT 254 313   Radiological Exams on Admission: Mr Laqueta Jean WU Contrast  03/27/2013  *RADIOLOGY REPORT*  Clinical Data: Headaches.  Abnormal CT.  MRI HEAD WITHOUT AND WITH CONTRAST  Technique:  Multiplanar, multiecho pulse sequences of the brain and surrounding structures were obtained according to standard protocol without and with intravenous contrast  Contrast: 17mL MULTIHANCE GADOBENATE DIMEGLUMINE 529 MG/ML IV SOLN  Comparison: CT head 03/25/2013.  Findings: There is no evidence for acute infarction, intracranial hemorrhage, mass lesion, hydrocephalus, or extra-axial fluid. There  is no evidence for cerebral or cerebellar atrophy, nor is there a large vessel infarct.  No foci of chronic hemorrhage are seen.  Small area of decreased T2 signal on gradient sequence along the undersurface of the temporal lobe on the right felt to lie within the tentorium.  On FLAIR sequence, there are numerous bilateral symmetric foci of increased T2 signal corresponding to the areas of hypoattenuation on CT.  These do not display restricted diffusion or abnormal post contrast enhancement.  There are no definite similar lesions in the brainstem or cerebellum.  Some, but not most, are periventricular location; the majority are subcortical. Considerations include a demyelinating process such as multiple sclerosis, complicated migraine with bilateral subcortical ischemia, vasculitis, chronic infection, or previous bilateral watershed ischemic insults.  There are no areas of cortical brain substance loss identified. Correlate clinically.  The major intracranial vascular structures appear widely patent including the major dural venous sinuses.  The pituitary and cerebellar tonsils are unremarkable.  No cervical disease.  No osseous findings.  Mild nasopharyngeal adenoidal hypertrophy. Negative orbits, sinuses, and mastoids.  IMPRESSION: There are symmetric foci of increased T2 signal corresponding to areas of hypoattenuation on CT.  These do not display restricted diffusion or post contrast enhancement.  Their appearance is nonspecific, and the differential is wide.  See discussion above.   Original  Report Authenticated By: Davonna Belling, M.D.     EKG: Normal sinus rhythm, no ST/T wave changes  Debbora Presto, MD  [facility] Pager [phone]  If 7PM-7AM, please contact night-coverage [email or web address] Password [facility] 03/28/2013, 6:24 PM

## 2013-03-28 NOTE — Progress Notes (Signed)
Patient admitted to 5E, transferred from doctor's office, alert and oriented, transferred in wheelchair, tolerated well, BP => 129/80, P=> 84, temp => 98.7, SATs=> 95% on RA, patient voiding clear yellow urine, skin dry and intact, c/o HA with some nausea, family at bedside mother and aunt, patient direct admit, doctor notified of patient arrival to unit, patient in stable condition at this time

## 2013-03-28 NOTE — Consult Note (Addendum)
NEURO HOSPITALIST CONSULT NOTE    Reason for Consult:  Intractable headache   HPI:                                                                                                                                          Cassandra Ellis is an 32 y.o. female with a history of hypothyroidism but no history of severe headaches who presented with headache of 5 days duration. She described intensity as 10/10. Pain involves the entire head. She had scotomas and a pressure-like sensation in the back of her head and neck on standing or sitting. She has not been febrile. Neck is been stiff. MRI of her brain showed nonspecific T2 white matter changes, but was otherwise unremarkable. Patient was given a combination of Rizatriptan, ibuprofen and Zofran and had a good response. She was also started on Robaxin 500 mg every 12 hours. She's asymptomatic at this point with no headache. She was seen in the emergency room over the weekend and was given analgesic medication which relieved the headache transiently, but experienced rebound severe headache.   Past Medical History  Diagnosis Date  . Thyroid disease     Past Surgical History  Procedure Laterality Date  . Tubal ligation      History reviewed. No pertinent family history.   Social History:  reports that she has never smoked. She has never used smokeless tobacco. She reports that she does not drink alcohol or use illicit drugs.  Allergies  Allergen Reactions  . Milk-Related Compounds Other (See Comments)    Abd cramping and gas  . Watermelon Flavor Hives    MEDICATIONS:                                                                                                                     Scheduled:  AOZ:[id number], methocarbamol, metoCLOPramide, morphine injection, ondansetron (ZOFRAN) IV, ondansetron, prochlorperazine   ROS:  History obtained from the patient  General ROS: negative for - chills, fatigue, fever, night sweats, weight gain or weight loss Psychological ROS: negative for - behavioral disorder, hallucinations, memory difficulties, mood swings or suicidal ideation Ophthalmic ROS: negative for -  scotomas with severe headache.  ENT ROS: negative for - epistaxis, nasal discharge, oral lesions, sore throat, tinnitus or vertigo Allergy and Immunology ROS: negative for - hives or itchy/watery eyes Hematological and Lymphatic ROS: negative for - bleeding problems, bruising or swollen lymph nodes Endocrine ROS: negative for - galactorrhea, hair pattern changes, polydipsia/polyuria or temperature intolerance Respiratory ROS: negative for - cough, hemoptysis, shortness of breath or wheezing Cardiovascular ROS: negative for - chest pain, dyspnea on exertion, edema or irregular heartbeat Gastrointestinal ROS: negative for - abdominal pain, diarrhea, hematemesis, nausea/vomiting or stool incontinence Genito-Urinary ROS: negative for - dysuria, hematuria, incontinence or urinary frequency/urgency Musculoskeletal ROS: negative for - joint swelling or muscular weakness Neurological ROS: as noted in HPI Dermatological ROS: negative for rash and skin lesion changes   Blood pressure 129/80, pulse 84, temperature 98 F (36.7 C), temperature source Oral, resp. rate 18, height 5\' 1"  (1.549 m), weight 71.079 kg (156 lb 11.2 oz), last menstrual period 03/01/2013, SpO2 95.00%.   Neurologic Examination:                                                                                                      Mental Status: Alert, oriented, thought content appropriate.  Speech fluent without evidence of aphasia. Able to follow commands without difficulty. Cranial Nerves: II-Visual fields were normal. III/IV/VI-Pupils were equal and reacted. Extraocular movements were full and conjugate.     V/VII-no facial numbness and no facial weakness. VIII-normal. X-normal speech and symmetrical palatal movement. Motor: 5/5 bilaterally with normal tone and bulk Sensory: Normal throughout. Deep Tendon Reflexes: 2+ and symmetric. Plantars: mute bilaterally Cerebellar: Normal finger-to-nose testing. Meningeal signs: Negative  No results found for this basename: cbc, bmp, coags, chol, tri, ldl, hga1c    Results for orders placed during the hospital encounter of 03/28/13 (from the past 48 hour(s))  COMPREHENSIVE METABOLIC PANEL     Status: Abnormal   Collection Time    03/28/13  3:12 PM      Result Value Range   Sodium 137  135 - 145 mEq/L   Potassium 3.3 (*) 3.5 - 5.1 mEq/L   Chloride 99  96 - 112 mEq/L   CO2 21  19 - 32 mEq/L   Glucose, Bld 104 (*) 70 - 99 mg/dL   BUN 14  6 - 23 mg/dL   Creatinine, Ser 1.61 (*) 0.50 - 1.10 mg/dL   Calcium 09.6  8.4 - 04.5 mg/dL   Total Protein 7.3  6.0 - 8.3 g/dL   Albumin 3.8  3.5 - 5.2 g/dL   AST 16  0 - 37 U/L   ALT 25  0 - 35 U/L   Alkaline Phosphatase 94  39 - 117 U/L   Total Bilirubin 0.9  0.3 - 1.2 mg/dL   GFR calc non Af Amer >90  >90  mL/min   GFR calc Af Amer >90  >90 mL/min   Comment:            The eGFR has been calculated     using the CKD EPI equation.     This calculation has not been     validated in all clinical     situations.     eGFR's persistently     <90 mL/min signify     possible Chronic Kidney Disease.  MAGNESIUM     Status: None   Collection Time    03/28/13  3:12 PM      Result Value Range   Magnesium 1.7  1.5 - 2.5 mg/dL  PHOSPHORUS     Status: None   Collection Time    03/28/13  3:12 PM      Result Value Range   Phosphorus 3.1  2.3 - 4.6 mg/dL  CBC WITH DIFFERENTIAL     Status: Abnormal   Collection Time    03/28/13  3:12 PM      Result Value Range   WBC 7.4  4.0 - 10.5 K/uL   RBC 5.17 (*) 3.87 - 5.11 MIL/uL   Hemoglobin 13.5  12.0 - 15.0 g/dL   HCT 16.1  09.6 - 04.5 %   MCV 75.2 (*) 78.0 -  100.0 fL   MCH 26.1  26.0 - 34.0 pg   MCHC 34.7  30.0 - 36.0 g/dL   RDW 40.9  81.1 - 91.4 %   Platelets 313  150 - 400 K/uL   Neutrophils Relative 68  43 - 77 %   Neutro Abs 5.0  1.7 - 7.7 K/uL   Lymphocytes Relative 22  12 - 46 %   Lymphs Abs 1.6  0.7 - 4.0 K/uL   Monocytes Relative 10  3 - 12 %   Monocytes Absolute 0.7  0.1 - 1.0 K/uL   Eosinophils Relative 0  0 - 5 %   Eosinophils Absolute 0.0  0.0 - 0.7 K/uL   Basophils Relative 0  0 - 1 %   Basophils Absolute 0.0  0.0 - 0.1 K/uL  APTT     Status: None   Collection Time    03/28/13  3:12 PM      Result Value Range   aPTT 28  24 - 37 seconds  PROTIME-INR     Status: None   Collection Time    03/28/13  3:12 PM      Result Value Range   Prothrombin Time 14.4  11.6 - 15.2 seconds   INR 1.14  0.00 - 1.49    Mr Laqueta Jean Wo Contrast  03/27/2013  *RADIOLOGY REPORT*  Clinical Data: Headaches.  Abnormal CT.  MRI HEAD WITHOUT AND WITH CONTRAST  Technique:  Multiplanar, multiecho pulse sequences of the brain and surrounding structures were obtained according to standard protocol without and with intravenous contrast  Contrast: 17mL MULTIHANCE GADOBENATE DIMEGLUMINE 529 MG/ML IV SOLN  Comparison: CT head 03/25/2013.  Findings: There is no evidence for acute infarction, intracranial hemorrhage, mass lesion, hydrocephalus, or extra-axial fluid. There is no evidence for cerebral or cerebellar atrophy, nor is there a large vessel infarct.  No foci of chronic hemorrhage are seen.  Small area of decreased T2 signal on gradient sequence along the undersurface of the temporal lobe on the right felt to lie within the tentorium.  On FLAIR sequence, there are numerous bilateral symmetric foci of increased T2 signal corresponding to the areas of hypoattenuation on  CT.  These do not display restricted diffusion or abnormal post contrast enhancement.  There are no definite similar lesions in the brainstem or cerebellum.  Some, but not most, are periventricular  location; the majority are subcortical. Considerations include a demyelinating process such as multiple sclerosis, complicated migraine with bilateral subcortical ischemia, vasculitis, chronic infection, or previous bilateral watershed ischemic insults.  There are no areas of cortical brain substance loss identified. Correlate clinically.  The major intracranial vascular structures appear widely patent including the major dural venous sinuses.  The pituitary and cerebellar tonsils are unremarkable.  No cervical disease.  No osseous findings.  Mild nasopharyngeal adenoidal hypertrophy. Negative orbits, sinuses, and mastoids.  IMPRESSION: There are symmetric foci of increased T2 signal corresponding to areas of hypoattenuation on CT.  These do not display restricted diffusion or post contrast enhancement.  Their appearance is nonspecific, and the differential is wide.  See discussion above.   Original Report Authenticated By: Davonna Belling, M.D.      Assessment/Plan: Persistent severe headache with vascular features, of unclear etiology. Headache has resolved at this point. Patient has been given ibuprofen as well as Robaxin which hopefully will prevent rebound headache recurrence. MRI findings are nonspecific and likely unrelated to patient's presenting problem. No further neurodiagnostic studies are indicated at this point since patient has no clinical signs of meningitis.  Recommend no changes in current management. We will continue to follow this patient with you.   Venetia Maxon M.D. [facility] [phone]  03/28/2013, 8:33 PM

## 2013-03-29 DIAGNOSIS — R946 Abnormal results of thyroid function studies: Secondary | ICD-10-CM

## 2013-03-29 DIAGNOSIS — E0590 Thyrotoxicosis, unspecified without thyrotoxic crisis or storm: Secondary | ICD-10-CM

## 2013-03-29 LAB — COMPREHENSIVE METABOLIC PANEL
ALT: 20 U/L (ref 0–35)
Albumin: 3.2 g/dL — ABNORMAL LOW (ref 3.5–5.2)
Alkaline Phosphatase: 82 U/L (ref 39–117)
BUN: 12 mg/dL (ref 6–23)
Chloride: 103 mEq/L (ref 96–112)
Glucose, Bld: 101 mg/dL — ABNORMAL HIGH (ref 70–99)
Potassium: 3.5 mEq/L (ref 3.5–5.1)
Total Bilirubin: 0.8 mg/dL (ref 0.3–1.2)

## 2013-03-29 LAB — CBC
MCHC: 34.6 g/dL (ref 30.0–36.0)
MCV: 75.5 fL — ABNORMAL LOW (ref 78.0–100.0)
Platelets: 267 10*3/uL (ref 150–400)
RDW: 13.1 % (ref 11.5–15.5)
WBC: 8.3 10*3/uL (ref 4.0–10.5)

## 2013-03-29 MED ORDER — DIPHENHYDRAMINE HCL 50 MG/ML IJ SOLN
25.0000 mg | Freq: Four times a day (QID) | INTRAMUSCULAR | Status: DC | PRN
  Administered 2013-03-29: 25 mg via INTRAVENOUS
  Filled 2013-03-29: qty 1

## 2013-03-29 MED ORDER — ADULT MULTIVITAMIN W/MINERALS CH
1.0000 | ORAL_TABLET | Freq: Every day | ORAL | Status: DC
  Administered 2013-03-29 – 2013-03-30 (×2): 1 via ORAL
  Filled 2013-03-29 (×2): qty 1

## 2013-03-29 MED ORDER — VALPROATE SODIUM 500 MG/5ML IV SOLN
500.0000 mg | Freq: Once | INTRAVENOUS | Status: AC
  Administered 2013-03-29: 500 mg via INTRAVENOUS
  Filled 2013-03-29: qty 5

## 2013-03-29 MED ORDER — PROCHLORPERAZINE EDISYLATE 5 MG/ML IJ SOLN
10.0000 mg | Freq: Four times a day (QID) | INTRAMUSCULAR | Status: DC | PRN
  Administered 2013-03-29: 10 mg via INTRAVENOUS

## 2013-03-29 MED ORDER — ENSURE COMPLETE PO LIQD
237.0000 mL | Freq: Two times a day (BID) | ORAL | Status: DC
  Administered 2013-03-30: 237 mL via ORAL

## 2013-03-29 NOTE — Progress Notes (Signed)
INITIAL NUTRITION ASSESSMENT  DOCUMENTATION CODES Per approved criteria  -Not Applicable   INTERVENTION: Provide Ensure Complete BID in between meals Provide Multivitamin with minerals daily  NUTRITION DIAGNOSIS: Inadequate oral intake related to headaches and nausea as evidenced by 8% wt loss in less than one week and pt's report of eating very little for the past 5 days.   Goal: Pt to meet >/= 90% of their estimated nutrition needs  Monitor:  PO intake Wt  Reason for Assessment: MST  32 y.o. female  Admitting Dx: Generalized headache  ASSESSMENT: 32 yo female who presented to ED with main concern of progressively worsening generalized headache that initially started 2 days prior to admission, 10/10 in severity, constant, non radiating, associated with nausea and non bloody vomiting, not relieved with OTC medications, no specific aggravating factors. Pt reports that she has has very little to eat since Saturday due to headaches and nausea. Pt usually weighs 170 lbs. Nausea has improved today and pt ate breakfast but, no lunch today per pt report (as of 2 PM). Pt reports that she is lactose intolerant.   Height: Ht Readings from Last 1 Encounters:  03/28/13 5\' 1"  (1.549 m)    Weight: Wt Readings from Last 1 Encounters:  03/28/13 156 lb 11.2 oz (71.079 kg)    Ideal Body Weight: 105 lbs  % Ideal Body Weight: 149%  Wt Readings from Last 10 Encounters:  03/28/13 156 lb 11.2 oz (71.079 kg)  03/25/13 170 lb (77.111 kg)  12/31/12 168 lb (76.204 kg)  12/20/12 172 lb (78.019 kg)  12/17/12 172 lb (78.019 kg)    Usual Body Weight: 170 lbs  % Usual Body Weight: 92%  BMI:  Body mass index is 29.62 kg/(m^2).  Estimated Nutritional Needs: Kcal: 1775-1990 Protein: 71-85 grams Fluid: 2.2 L  Skin: WDL  Diet Order: General  EDUCATION NEEDS: -No education needs identified at this time   Intake/Output Summary (Last 24 hours) at 03/29/13 1525 Last data filed at  03/29/13 0400  Gross per 24 hour  Intake 956.25 ml  Output      0 ml  Net 956.25 ml    Last BM: 4/15  Labs:   Recent Labs Lab 03/25/13 1741 03/28/13 1512 03/29/13 0515  NA 138 137 138  K 4.3 3.3* 3.5  CL 107 99 103  CO2 21 21 25   BUN 10 14 12   CREATININE 0.40* 0.34* 0.40*  CALCIUM 9.0 10.1 9.3  MG  --  1.7  --   PHOS  --  3.1  --   GLUCOSE 99 104* 101*    CBG (last 3)  No results found for this basename: GLUCAP,  in the last 72 hours  Scheduled Meds:   Continuous Infusions: . sodium chloride 75 mL/hr (03/28/13 1515)    Past Medical History  Diagnosis Date  . Thyroid disease     Past Surgical History  Procedure Laterality Date  . Tubal ligation      Ian Malkin RD, LDN Inpatient Clinical Dietitian Pager: [phone] After Hours Pager: [phone]

## 2013-03-29 NOTE — Progress Notes (Signed)
[facility] PROGRESS NOTE  Cassandra Ellis ZOX:[id number] DOB: 07-15-1981 DOA: 03/28/2013 PCP: Jackie Plum, MD  Assessment/Plan: Principal Problem:  Headache  - unclear etiology as pt has no similar events in the past, possible migraine, MRI of the brain with no specific acute findings to explain headache  - appreciate neuro input pt given dose of depacon and to continue Compazine and Benadryl started last pm - recommendation is to avoid tylenol, Vicodin, initiate  - spoke with Dr Amada Jupiter today and he states that with the pt's suppressed TSH -  Possible hyperthyroidism is likely etiology of her headaches, and in that case he recommends home when medically ready on gabapentin with compazine /benadryl po, but not the medrol dose pack as stated in his note  Active Problems:  Nausea and vomiting  - possibly associated and related to headaches, will provide Zofran for symptomatic relief of symptoms  Hypokalemia  - secondary to vomiting  - resolved Suppressed TSH - has h/o thyroid disease, but on no listed meds -will obtain free T4 and T3 to further eval and follow   Code Status: full Family Communication: directly with pt at bedside Disposition Plan: to home when medically stable   Consultants:  neuro  Procedures:  none  Antibiotics:  none  HPI/Subjective: States still with headache this am, admits to photophobia and phonophobia  Objective: Filed Vitals:   03/28/13 1454 03/28/13 2125 03/29/13 0546 03/29/13 1321  BP: 129/80 138/80 107/64 111/72  Pulse: 84 86 82 87  Temp: 98 F (36.7 C) 98.2 F (36.8 C) 98.5 F (36.9 C) 98.8 F (37.1 C)  TempSrc: Oral Oral Oral Oral  Resp: 18 18 18 18   Height: 5\' 1"  (1.549 m)     Weight: 71.079 kg (156 lb 11.2 oz)     SpO2: 95% 97% 97% 98%    Intake/Output Summary (Last 24 hours) at 03/29/13 1623 Last data filed at 03/29/13 0400  Gross per 24 hour  Intake 956.25 ml  Output      0 ml  Net 956.25 ml   Filed  Weights   03/28/13 1454  Weight: 71.079 kg (156 lb 11.2 oz)    Exam:   General:  Young female in NAD, alert and oriented x3  Cardiovascular: RRR nl S1S2  Respiratory: CTAB  Abdomen: Soft +BS NT/ND  Extremities: No cyanosis and no edema  Data Reviewed: Basic Metabolic Panel:  Recent Labs Lab 03/25/13 1741 03/28/13 1512 03/29/13 0515  NA 138 137 138  K 4.3 3.3* 3.5  CL 107 99 103  CO2 21 21 25   GLUCOSE 99 104* 101*  BUN 10 14 12   CREATININE 0.40* 0.34* 0.40*  CALCIUM 9.0 10.1 9.3  MG  --  1.7  --   PHOS  --  3.1  --    Liver Function Tests:  Recent Labs Lab 03/28/13 1512 03/29/13 0515  AST 16 13  ALT 25 20  ALKPHOS 94 82  BILITOT 0.9 0.8  PROT 7.3 6.6  ALBUMIN 3.8 3.2*   No results found for this basename: LIPASE, AMYLASE,  in the last 168 hours No results found for this basename: AMMONIA,  in the last 168 hours CBC:  Recent Labs Lab 03/25/13 1741 03/28/13 1512 03/29/13 0515  WBC 6.8 7.4 8.3  NEUTROABS  --  5.0  --   HGB 12.0 13.5 12.6  HCT 34.2* 38.9 36.4  MCV 76.0* 75.2* 75.5*  PLT 254 313 267   Cardiac Enzymes: No results found for this basename: CKTOTAL, CKMB,  CKMBINDEX, TROPONINI,  in the last 168 hours BNP (last 3 results) No results found for this basename: PROBNP,  in the last 8760 hours CBG: No results found for this basename: GLUCAP,  in the last 168 hours  No results found for this or any previous visit (from the past 240 hour(s)).   Studies: Mr Laqueta Jean UJ Contrast  Apr 14, 2013  *RADIOLOGY REPORT*  Clinical Data: Headaches.  Abnormal CT.  MRI HEAD WITHOUT AND WITH CONTRAST  Technique:  Multiplanar, multiecho pulse sequences of the brain and surrounding structures were obtained according to standard protocol without and with intravenous contrast  Contrast: 17mL MULTIHANCE GADOBENATE DIMEGLUMINE 529 MG/ML IV SOLN  Comparison: CT head 03/25/2013.  Findings: There is no evidence for acute infarction, intracranial hemorrhage, mass  lesion, hydrocephalus, or extra-axial fluid. There is no evidence for cerebral or cerebellar atrophy, nor is there a large vessel infarct.  No foci of chronic hemorrhage are seen.  Small area of decreased T2 signal on gradient sequence along the undersurface of the temporal lobe on the right felt to lie within the tentorium.  On FLAIR sequence, there are numerous bilateral symmetric foci of increased T2 signal corresponding to the areas of hypoattenuation on CT.  These do not display restricted diffusion or abnormal post contrast enhancement.  There are no definite similar lesions in the brainstem or cerebellum.  Some, but not most, are periventricular location; the majority are subcortical. Considerations include a demyelinating process such as multiple sclerosis, complicated migraine with bilateral subcortical ischemia, vasculitis, chronic infection, or previous bilateral watershed ischemic insults.  There are no areas of cortical brain substance loss identified. Correlate clinically.  The major intracranial vascular structures appear widely patent including the major dural venous sinuses.  The pituitary and cerebellar tonsils are unremarkable.  No cervical disease.  No osseous findings.  Mild nasopharyngeal adenoidal hypertrophy. Negative orbits, sinuses, and mastoids.  IMPRESSION: There are symmetric foci of increased T2 signal corresponding to areas of hypoattenuation on CT.  These do not display restricted diffusion or post contrast enhancement.  Their appearance is nonspecific, and the differential is wide.  See discussion above.   Original Report Authenticated By: Davonna Belling, M.D.     Scheduled Meds: . feeding supplement  237 mL Oral BID BM  . multivitamin with minerals  1 tablet Oral Daily   Continuous Infusions: . sodium chloride 75 mL/hr (03/28/13 1515)    Principal Problem:   Generalized headache Active Problems:   Nausea and vomiting   Hypokalemia   Intractable headache    Time  spent:     [facility]  [facility] Pager [phone]. If 7PM-7AM, please contact night-coverage at [email or web address], password [facility] 03/29/2013, 4:23 PM  LOS: 1 day

## 2013-03-29 NOTE — Progress Notes (Addendum)
NEURO HOSPITALIST PROGRESS NOTE   SUBJECTIVE:                                                                                                                        Complaining of a 8/10 HA. States that the Vicodin and Morphine do take away the HA but once the medication wears off the HA will return.   OBJECTIVE:                                                                                                                           Vital signs in last 24 hours: Temp:  [98 F (36.7 C)-98.5 F (36.9 C)] 98.5 F (36.9 C) (04/17 0546) Pulse Rate:  [82-86] 82 (04/17 0546) Resp:  [18] 18 (04/17 0546) BP: (107-138)/(64-80) 107/64 mmHg (04/17 0546) SpO2:  [95 %-97 %] 97 % (04/17 0546) Weight:  [71.079 kg (156 lb 11.2 oz)] 71.079 kg (156 lb 11.2 oz) (04/16 1454)  Intake/Output from previous day: 04/16 0701 - 04/17 0700 In: 956.3 [I.V.:956.3] Out: -  Intake/Output this shift:   Nutritional status: General  Past Medical History  Diagnosis Date  . Thyroid disease      Neurologic Exam:  Mental Status: Alert, oriented, thought content appropriate.  Speech fluent without evidence of aphasia.  Able to follow 3 step commands without difficulty. Cranial Nerves: II: Discs flat bilaterally; Visual fields grossly normal, pupils equal, round, reactive to light and accommodation III,IV, VI: ptosis not present, extra-ocular motions intact bilaterally V,VII: smile symmetric, facial light touch sensation normal bilaterally VIII: hearing normal bilaterally IX,X: gag reflex present XI: bilateral shoulder shrug XII: midline tongue extension Motor: Right : Upper extremity   5/5    Left:     Upper extremity   5/5  Lower extremity   5/5     Lower extremity   5/5 Tone and bulk:normal tone throughout; no atrophy noted Sensory: Pinprick and light touch intact throughout, bilaterally Deep Tendon Reflexes: 2+ and symmetric throughout Plantars: Right:  downgoing   Left: downgoing Cerebellar: normal finger-to-nose,  normal heel-to-shin test  CV: pulses palpable throughout    Lab Results: No results found for this basename: cbc, bmp, coags, chol, tri, ldl, hga1c   Lipid Panel No results  found for this basename: CHOL, TRIG, HDL, CHOLHDL, VLDL, LDLCALC,  in the last 72 hours  Studies/Results: Mr Laqueta Jean Wo Contrast  03/27/2013  *RADIOLOGY REPORT*  Clinical Data: Headaches.  Abnormal CT.  MRI HEAD WITHOUT AND WITH CONTRAST  Technique:  Multiplanar, multiecho pulse sequences of the brain and surrounding structures were obtained according to standard protocol without and with intravenous contrast  Contrast: 17mL MULTIHANCE GADOBENATE DIMEGLUMINE 529 MG/ML IV SOLN  Comparison: CT head 03/25/2013.  Findings: There is no evidence for acute infarction, intracranial hemorrhage, mass lesion, hydrocephalus, or extra-axial fluid. There is no evidence for cerebral or cerebellar atrophy, nor is there a large vessel infarct.  No foci of chronic hemorrhage are seen.  Small area of decreased T2 signal on gradient sequence along the undersurface of the temporal lobe on the right felt to lie within the tentorium.  On FLAIR sequence, there are numerous bilateral symmetric foci of increased T2 signal corresponding to the areas of hypoattenuation on CT.  These do not display restricted diffusion or abnormal post contrast enhancement.  There are no definite similar lesions in the brainstem or cerebellum.  Some, but not most, are periventricular location; the majority are subcortical. Considerations include a demyelinating process such as multiple sclerosis, complicated migraine with bilateral subcortical ischemia, vasculitis, chronic infection, or previous bilateral watershed ischemic insults.  There are no areas of cortical brain substance loss identified. Correlate clinically.  The major intracranial vascular structures appear widely patent including the major dural venous  sinuses.  The pituitary and cerebellar tonsils are unremarkable.  No cervical disease.  No osseous findings.  Mild nasopharyngeal adenoidal hypertrophy. Negative orbits, sinuses, and mastoids.  IMPRESSION: There are symmetric foci of increased T2 signal corresponding to areas of hypoattenuation on CT.  These do not display restricted diffusion or post contrast enhancement.  Their appearance is nonspecific, and the differential is wide.  See discussion above.   Original Report Authenticated By: Davonna Belling, M.D.     MEDICATIONS                                                                                                                        Scheduled: . valproate sodium  500 mg Intravenous Once    ASSESSMENT/PLAN:                                                                                                             Persistent severe headache with vascular features, of unclear etiology. Headache has returned at 8/10 at this point but patient  is watching TV in room comfortably. Patient stating that the Vicodin and Morphine are helpful but when medication wears off HA returns. MRI findings are nonspecific and likely unrelated to patient's presenting problem. Recommend Compazine with Benadryl together every 6 hours as needed and will also give 500 mg Depakote IV once now over 15 minutes.  Patient may be discharged on the compazine and benadryl in oral form for HA.   Assessment and plan discussed with with attending physician and they are in agreement.    Felicie Morn PA-C [facility] [phone]  03/29/2013, 9:05 AM    I have seen and evaluated the patient. I have reviewed the above note and made appropriate changes. New onset throbbing headache with occasional scotoma. Her headache is currently completely resolved. Her fundi do not demonstrate any evidence of papilledema. MRI shows non-specific changes which in the appropriate setting could represent demyelinating disease, but do  not feel that she has this history. She is from [place], and could call to schedule a neurology appointment there.   In the interim, I would give a temporary course of steroids(medrol dose pack) and start gabapentin 300mg  TID. This can be done as an outpatient. She can take compazine 10mg  PO + benadryl 25mg  PO for breakthrough headache.   Ritta Slot, MD [facility] [phone]  If 7pm- 7am, please page neurology on call at [phone].

## 2013-03-30 MED ORDER — PROCHLORPERAZINE MALEATE 10 MG PO TABS: 10.0000 mg | ORAL_TABLET | Freq: Four times a day (QID) | ORAL | Status: DC | PRN

## 2013-03-30 MED ORDER — METHIMAZOLE 10 MG PO TABS: 40.0000 mg | ORAL_TABLET | Freq: Every day | ORAL | Status: DC

## 2013-03-30 MED ORDER — GABAPENTIN 300 MG PO CAPS: 300.0000 mg | ORAL_CAPSULE | Freq: Three times a day (TID) | ORAL | Status: DC

## 2013-03-30 MED ORDER — DIPHENHYDRAMINE HCL 25 MG PO CAPS: 25.0000 mg | ORAL_CAPSULE | Freq: Four times a day (QID) | ORAL | Status: DC | PRN

## 2013-03-30 NOTE — Progress Notes (Signed)
Discharge instructions given to pt, verbalized understanding. Left the unit in stable condition. 

## 2013-03-30 NOTE — Discharge Summary (Signed)
Physician Discharge Summary  Cassandra Ellis MVH:[id number] DOB: 11/13/1981 DOA: 03/28/2013  PCP: Jackie Plum, MD  Admit date: 03/28/2013 Discharge date: 03/30/2013  Time spent: >30 minutes  Recommendations for Outpatient Follow-up:      Follow-up Information   Follow up with OSEI-BONSU,GEORGE, MD. (as scheduled)    Contact information:   [address], [address] [place] [address] [phone]       Follow up with KERR,JEFFREY, MD. (next week, call office for appt upon discharge)    Contact information:   [address] [facility] [place] [address] [phone]       Discharge Diagnoses:  Principal Problem:   Generalized headache Active Problems:   Nausea and vomiting   Hypokalemia   Intractable headache Hyperthyroidism  Discharge Condition: Improved/stable  Diet recommendation: Regular  Filed Weights   03/28/13 1454  Weight: 71.079 kg (156 lb 11.2 oz)    History of present illness:  Pt is 32 yo female who presented to ED with main concern of progressively worsening generalized headache that initially started 2 days prior to admission, 10/10 in severity, constant, non radiating, associated with nausea and non bloody vomiting, not relieved with OTC medications, no specific aggravating factors. Pt denies similar events in the past, no history of migraines or other types of headaches, no visual changes, no fevers, chills, no neck stiffness. Pt also denies other associated symptoms. Pt denies chest pain or shortness of breath.    Hospital Course:  Headache  - pt has no similar events in the past, possible migraine, MRI of the brain with no specific acute findings to explain headache  -Per neuro likely secondary to hyperthyroidism - appreciate neuro input pt given dose of depacon and to continue upon admission patient was started on Compazine and Benadryl started last pm, along with when necessary morphine. Neurology followed  and she was given a dose of Depacon, and they recommended to avoid tylenol, Vicodin - Dr Amada Jupiter followed up with pt and also recommended gabapentin on discharge with compazine /benadryl po for breakthrough headaches. -Patient is headache free at this time, and will be discharged for outpatient followup. Her PCP to set up for neuro end [place] as recommended. Active Problems:  Nausea and vomiting  - possibly associated and related to headaches, resolved with treatment as above.  Hypokalemia  - secondary to vomiting  - resolved  Hyperthyroidism - It was noted on admission that she has h/o thyroid disease, but on no listed meds  -A TSH was done and came back less than 0.008, the total T3 was elevated at 463.7, and free T4 elevated at 5.56. -Discussed patient with endocrinologist -- Dr. Sharl Ma and he recommends starting patient on methimazole 40 mg once daily,  she states she's not pregnant. She is to follow up next week at his office for further evaluation and management as appropriate. -She states that she was diagnosed with hyperthyroidism are when she was pregnant and at that time she was treated with PTU.   Procedures:  none  Consultations:  neurology  Discharge Exam: Filed Vitals:   03/29/13 0546 03/29/13 1321 03/29/13 2100 03/30/13 0554  BP: 107/64 111/72 126/78 108/70  Pulse: 82 87 98 87  Temp: 98.5 F (36.9 C) 98.8 F (37.1 C) 98 F (36.7 C) 98.1 F (36.7 C)  TempSrc: Oral Oral Oral Oral  Resp: 18 18 18 18   Height:      Weight:      SpO2: 97% 98% 99% 98%  Exam:  General: Young female in NAD, alert and oriented x3  Cardiovascular: RRR nl S1S2  Respiratory: CTAB  Abdomen: Soft +BS NT/ND  Extremities: No cyanosis and no edema   Discharge Instructions  Discharge Orders   Future Orders Complete By Expires     Diet general  As directed     Increase activity slowly  As directed         Medication List    TAKE these medications       diphenhydrAMINE 25  mg capsule  Commonly known as:  BENADRYL  Take 1 capsule (25 mg total) by mouth every 6 (six) hours as needed (together with compazine for breakthrough headaches).     gabapentin 300 MG capsule  Commonly known as:  NEURONTIN  Take 1 capsule (300 mg total) by mouth 3 (three) times daily.     methimazole 10 MG tablet  Commonly known as:  TAPAZOLE  Take 4 tablets (40 mg total) by mouth daily.     ondansetron 4 MG tablet  Commonly known as:  ZOFRAN  Take 1 tablet (4 mg total) by mouth every 6 (six) hours.     prochlorperazine 10 MG tablet  Commonly known as:  COMPAZINE  Take 1 tablet (10 mg total) by mouth every 6 (six) hours as needed (break through headaches).           Follow-up Information   Follow up with OSEI-BONSU,GEORGE, MD. (as scheduled)    Contact information:   [address], [address] [place] [address] [phone]       Follow up with KERR,JEFFREY, MD. (next week, call office for appt upon discharge)    Contact information:   [address] [facility] [place] [address] [phone]        The results of significant diagnostics from this hospitalization (including imaging, microbiology, ancillary and laboratory) are listed below for reference.    Significant Diagnostic Studies: Ct Head Wo Contrast  03/25/2013  *RADIOLOGY REPORT*  Clinical Data: Headaches.  CT HEAD WITHOUT CONTRAST  Technique:  Contiguous axial images were obtained from the base of the skull through the vertex without contrast.  Comparison: None.  Findings: There are small periventricular white matter lucencies in both frontal lobes, more on the right than the left.  There is no hemorrhage or acute infarction or mass lesion.  No atrophy. Osseous structures are normal.  IMPRESSION: Small nonspecific white matter lucencies in the frontal lobes. These may represent small vessel ischemic changes.  Does the patient have a history of migraines?  Does the patient  have fever or other signs of infection?   Original Report Authenticated By: Francene Boyers, M.D.    Mr Laqueta Jean IO Contrast  03/27/2013  *RADIOLOGY REPORT*  Clinical Data: Headaches.  Abnormal CT.  MRI HEAD WITHOUT AND WITH CONTRAST  Technique:  Multiplanar, multiecho pulse sequences of the brain and surrounding structures were obtained according to standard protocol without and with intravenous contrast  Contrast: 17mL MULTIHANCE GADOBENATE DIMEGLUMINE 529 MG/ML IV SOLN  Comparison: CT head 03/25/2013.  Findings: There is no evidence for acute infarction, intracranial hemorrhage, mass lesion, hydrocephalus, or extra-axial fluid. There is no evidence for cerebral or cerebellar atrophy, nor is there a large vessel infarct.  No foci of chronic hemorrhage are seen.  Small area of decreased T2 signal on gradient sequence along the undersurface of the temporal lobe on the right felt to lie within the tentorium.  On FLAIR sequence, there  are numerous bilateral symmetric foci of increased T2 signal corresponding to the areas of hypoattenuation on CT.  These do not display restricted diffusion or abnormal post contrast enhancement.  There are no definite similar lesions in the brainstem or cerebellum.  Some, but not most, are periventricular location; the majority are subcortical. Considerations include a demyelinating process such as multiple sclerosis, complicated migraine with bilateral subcortical ischemia, vasculitis, chronic infection, or previous bilateral watershed ischemic insults.  There are no areas of cortical brain substance loss identified. Correlate clinically.  The major intracranial vascular structures appear widely patent including the major dural venous sinuses.  The pituitary and cerebellar tonsils are unremarkable.  No cervical disease.  No osseous findings.  Mild nasopharyngeal adenoidal hypertrophy. Negative orbits, sinuses, and mastoids.  IMPRESSION: There are symmetric foci of increased T2 signal  corresponding to areas of hypoattenuation on CT.  These do not display restricted diffusion or post contrast enhancement.  Their appearance is nonspecific, and the differential is wide.  See discussion above.   Original Report Authenticated By: Davonna Belling, M.D.     Microbiology: No results found for this or any previous visit (from the past 240 hour(s)).   Labs: Basic Metabolic Panel:  Recent Labs Lab 03/25/13 1741 03/28/13 1512 03/29/13 0515  NA 138 137 138  K 4.3 3.3* 3.5  CL 107 99 103  CO2 21 21 25   GLUCOSE 99 104* 101*  BUN 10 14 12   CREATININE 0.40* 0.34* 0.40*  CALCIUM 9.0 10.1 9.3  MG  --  1.7  --   PHOS  --  3.1  --    Liver Function Tests:  Recent Labs Lab 03/28/13 1512 03/29/13 0515  AST 16 13  ALT 25 20  ALKPHOS 94 82  BILITOT 0.9 0.8  PROT 7.3 6.6  ALBUMIN 3.8 3.2*   No results found for this basename: LIPASE, AMYLASE,  in the last 168 hours No results found for this basename: AMMONIA,  in the last 168 hours CBC:  Recent Labs Lab 03/25/13 1741 03/28/13 1512 03/29/13 0515  WBC 6.8 7.4 8.3  NEUTROABS  --  5.0  --   HGB 12.0 13.5 12.6  HCT 34.2* 38.9 36.4  MCV 76.0* 75.2* 75.5*  PLT 254 313 267   Cardiac Enzymes: No results found for this basename: CKTOTAL, CKMB, CKMBINDEX, TROPONINI,  in the last 168 hours BNP: BNP (last 3 results) No results found for this basename: PROBNP,  in the last 8760 hours CBG: No results found for this basename: GLUCAP,  in the last 168 hours     Signed:  Payson Evrard C  [facility] 03/30/2013, 11:03 AM

## 2013-03-30 NOTE — Progress Notes (Signed)
NEURO HOSPITALIST PROGRESS NOTE   SUBJECTIVE:                                                                                                                        Patient is HA free and sleeping soundly.  No complaints. Would like to "go home".   OBJECTIVE:                                                                                                                           Vital signs in last 24 hours: Temp:  [98 F (36.7 C)-98.8 F (37.1 C)] 98.1 F (36.7 C) (04/18 0554) Pulse Rate:  [87-98] 87 (04/18 0554) Resp:  [18] 18 (04/18 0554) BP: (108-126)/(70-78) 108/70 mmHg (04/18 0554) SpO2:  [98 %-99 %] 98 % (04/18 0554)  Intake/Output from previous day: 04/17 0701 - 04/18 0700 In: 1800 [I.V.:1800] Out: -  Intake/Output this shift:   Nutritional status: General  Past Medical History  Diagnosis Date  . Thyroid disease      Neurologic Exam:  Mental Status: Alert, oriented, thought content appropriate.  Speech fluent without evidence of aphasia.  Able to follow 3 step commands without difficulty. Cranial Nerves: II: Discs flat bilaterally; Visual fields grossly normal, pupils equal, round, reactive to light and accommodation III,IV, VI: ptosis not present, extra-ocular motions intact bilaterally V,VII: smile symmetric, facial light touch sensation normal bilaterally VIII: hearing normal bilaterally IX,X: gag reflex present XI: bilateral shoulder shrug XII: midline tongue extension Motor: Right : Upper extremity   5/5    Left:     Upper extremity   5/5  Lower extremity   5/5     Lower extremity   5/5 Tone and bulk:normal tone throughout; no atrophy noted Sensory: Pinprick and light touch intact throughout, bilaterally Deep Tendon Reflexes: 2+ and symmetric throughout Plantars: Right: downgoing   Left: downgoing Cerebellar: normal finger-to-nose,  normal heel-to-shin test CV: pulses palpable throughout    Lab Results: No  results found for this basename: cbc, bmp, coags, chol, tri, ldl, hga1c   Lipid Panel No results found for this basename: CHOL, TRIG, HDL, CHOLHDL, VLDL, LDLCALC,  in the last 72 hours  Studies/Results: No results found.  MEDICATIONS  Scheduled: . feeding supplement  237 mL Oral BID BM  . multivitamin with minerals  1 tablet Oral Daily   ION:[id number], methocarbamol, morphine injection, ondansetron (ZOFRAN) IV, ondansetron, prochlorperazine, prochlorperazine  ASSESSMENT/PLAN:                                                                                                            HA:  Possibly due to her hyperthyroidism  Patient HA has resolved.   Recommend discharging patient home on compazine 10mg  PO + benadryl 25mg  PO for breakthrough headache and consider neurontin 300 mg TID. IF continues to have HA as out patient where is from [place] and could make out patient appointment with local neurologist.  No further recommendations. Neurology will S/O   Assessment and plan discussed with with attending physician and they are in agreement.    Felicie Morn PA-C [facility] [phone]  03/30/2013, 8:58 AM  I have seen and evaluated the patient. I have reviewed the above note and made appropriate changes. Normal gait and tandem, neg romberg. VFF.   Resolved headache. She also has hypothyroidism. If headacehs recurr, would start gabapentin 300mg  TID and can take compazine/benadryl prn.   Ritta Slot, MD [facility] [phone]  If 7pm- 7am, please page neurology on call at [phone].

## 2013-12-04 ENCOUNTER — Encounter (HOSPITAL_BASED_OUTPATIENT_CLINIC_OR_DEPARTMENT_OTHER): Payer: Self-pay | Admitting: Emergency Medicine

## 2013-12-04 DIAGNOSIS — J069 Acute upper respiratory infection, unspecified: Secondary | ICD-10-CM | POA: Insufficient documentation

## 2013-12-04 DIAGNOSIS — Z79899 Other long term (current) drug therapy: Secondary | ICD-10-CM | POA: Insufficient documentation

## 2013-12-04 DIAGNOSIS — Z862 Personal history of diseases of the blood and blood-forming organs and certain disorders involving the immune mechanism: Secondary | ICD-10-CM | POA: Insufficient documentation

## 2013-12-04 DIAGNOSIS — J019 Acute sinusitis, unspecified: Secondary | ICD-10-CM | POA: Insufficient documentation

## 2013-12-04 DIAGNOSIS — Z8639 Personal history of other endocrine, nutritional and metabolic disease: Secondary | ICD-10-CM | POA: Insufficient documentation

## 2013-12-04 NOTE — ED Notes (Signed)
C/o head and chest congestion x 2 months-NAD

## 2013-12-05 ENCOUNTER — Emergency Department (HOSPITAL_BASED_OUTPATIENT_CLINIC_OR_DEPARTMENT_OTHER)
Admission: EM | Admit: 2013-12-05 | Discharge: 2013-12-05 | Disposition: A | Discharge status: Home / Self Care | Payer: BC Managed Care – PPO | Attending: Emergency Medicine | Admitting: Emergency Medicine

## 2013-12-05 DIAGNOSIS — J069 Acute upper respiratory infection, unspecified: Secondary | ICD-10-CM

## 2013-12-05 DIAGNOSIS — J019 Acute sinusitis, unspecified: Secondary | ICD-10-CM

## 2013-12-05 MED ORDER — AZITHROMYCIN 250 MG PO TABS: 250.0000 mg | ORAL_TABLET | Freq: Every day | ORAL | Status: DC

## 2013-12-05 MED ORDER — AZITHROMYCIN 250 MG PO TABS
500.0000 mg | ORAL_TABLET | Freq: Once | ORAL | Status: AC
  Administered 2013-12-05: 500 mg via ORAL
  Filled 2013-12-05: qty 2

## 2013-12-05 NOTE — ED Provider Notes (Signed)
CSN: [id number]     Arrival date & time 12/04/13  2155 History   First MD Initiated Contact with Patient 12/05/13 0051     Chief Complaint  Patient presents with  . URI   (Consider location/radiation/quality/duration/timing/severity/associated sxs/prior Treatment) Patient is a 32 y.o. female presenting with URI. The history is provided by the patient.  URI Presenting symptoms: congestion, cough, facial pain and rhinorrhea   Severity:  Moderate Onset quality:  Gradual Duration:  2 months Timing:  Constant Progression:  Worsening Chronicity:  New Relieved by:  Nothing Worsened by:  Nothing tried   Past Medical History  Diagnosis Date  . Thyroid disease    Past Surgical History  Procedure Laterality Date  . Tubal ligation     No family history on file. History  Substance Use Topics  . Smoking status: Never Smoker   . Smokeless tobacco: Never Used  . Alcohol Use: No   OB History   Grav Para Term Preterm Abortions TAB SAB Ect Mult Living                 Review of Systems  HENT: Positive for congestion and rhinorrhea.   Respiratory: Positive for cough.   All other systems reviewed and are negative.    Allergies  Milk-related compounds and Watermelon flavor  Home Medications   Current Outpatient Rx  Name  Route  Sig  Dispense  Refill  . diphenhydrAMINE (BENADRYL) 25 mg capsule   Oral   Take 1 capsule (25 mg total) by mouth every 6 (six) hours as needed (together with compazine for breakthrough headaches).   30 capsule   0   . gabapentin (NEURONTIN) 300 MG capsule   Oral   Take 1 capsule (300 mg total) by mouth 3 (three) times daily.   90 capsule   0   . methimazole (TAPAZOLE) 10 MG tablet   Oral   Take 4 tablets (40 mg total) by mouth daily.   120 tablet   0   . ondansetron (ZOFRAN) 4 MG tablet   Oral   Take 1 tablet (4 mg total) by mouth every 6 (six) hours.   12 tablet   0   . prochlorperazine (COMPAZINE) 10 MG tablet   Oral   Take 1 tablet  (10 mg total) by mouth every 6 (six) hours as needed (break through headaches).   30 tablet   0    BP 145/80  Pulse 105  Temp(Src) 99.1 F (37.3 C) (Oral)  Resp 16  Ht 5\' 1"  (1.549 m)  Wt 170 lb (77.111 kg)  BMI 32.14 kg/m2  SpO2 100%  LMP 11/30/2013 Physical Exam  Nursing note and vitals reviewed. Constitutional: She is oriented to person, place, and time. She appears well-developed and well-nourished. No distress.  HENT:  Head: Normocephalic and atraumatic.  Mouth/Throat: Oropharynx is clear and moist.  Bilateral TMs are clear.    There is maxillofacial tenderness present.  Neck: Normal range of motion. Neck supple.  Cardiovascular: Normal rate and regular rhythm.  Exam reveals no gallop and no friction rub.   No murmur heard. Pulmonary/Chest: Effort normal and breath sounds normal. No respiratory distress. She has no wheezes.  Abdominal: Soft. Bowel sounds are normal. She exhibits no distension. There is no tenderness.  Musculoskeletal: Normal range of motion.  Neurological: She is alert and oriented to person, place, and time.  Skin: Skin is warm and dry. She is not diaphoretic.    ED Course  Procedures (including critical  care time) Labs Review Labs Reviewed - No data to display Imaging Review No results found.    MDM  No diagnosis found. Patient presents with a two-month history of upper respiratory infection. Due to the length of the illness and lack of response over-the-counter medications I will treat with Zithromax. To followup when necessary if not improving.    Geoffery Lyons, MD 12/05/13 [phone]

## 2015-06-24 ENCOUNTER — Emergency Department (HOSPITAL_BASED_OUTPATIENT_CLINIC_OR_DEPARTMENT_OTHER)
Admission: EM | Admit: 2015-06-24 | Discharge: 2015-06-25 | Disposition: A | Discharge status: Home / Self Care | Payer: Medicaid Other | Attending: Emergency Medicine | Admitting: Emergency Medicine

## 2015-06-24 ENCOUNTER — Encounter (HOSPITAL_BASED_OUTPATIENT_CLINIC_OR_DEPARTMENT_OTHER): Payer: Self-pay

## 2015-06-24 DIAGNOSIS — R102 Pelvic and perineal pain: Secondary | ICD-10-CM | POA: Diagnosis present

## 2015-06-24 DIAGNOSIS — Z3202 Encounter for pregnancy test, result negative: Secondary | ICD-10-CM | POA: Insufficient documentation

## 2015-06-24 DIAGNOSIS — Z9851 Tubal ligation status: Secondary | ICD-10-CM | POA: Diagnosis not present

## 2015-06-24 DIAGNOSIS — D259 Leiomyoma of uterus, unspecified: Secondary | ICD-10-CM | POA: Diagnosis not present

## 2015-06-24 DIAGNOSIS — Z8639 Personal history of other endocrine, nutritional and metabolic disease: Secondary | ICD-10-CM | POA: Diagnosis not present

## 2015-06-24 LAB — URINALYSIS, ROUTINE W REFLEX MICROSCOPIC
BILIRUBIN URINE: NEGATIVE
GLUCOSE, UA: NEGATIVE mg/dL
Ketones, ur: NEGATIVE mg/dL
Leukocytes, UA: NEGATIVE
NITRITE: NEGATIVE
PROTEIN: NEGATIVE mg/dL
Specific Gravity, Urine: 1.03 (ref 1.005–1.030)
Urobilinogen, UA: 1 mg/dL (ref 0.0–1.0)
pH: 6 (ref 5.0–8.0)

## 2015-06-24 LAB — URINE MICROSCOPIC-ADD ON: WBC, UA: NONE SEEN WBC/hpf (ref <3)

## 2015-06-24 LAB — PREGNANCY, URINE: Preg Test, Ur: NEGATIVE

## 2015-06-24 NOTE — ED Provider Notes (Signed)
CSN: [id number]     Arrival date & time 06/24/15  2213 History  This chart was scribed for Cassandra Rosser, MD by Chester Holstein, ED Scribe. This patient was seen in room MH05/MH05 and the patient's care was started at 11:47 PM.    Chief Complaint  Patient presents with  . Pelvic Pain    The history is provided by the patient. No language interpreter was used.     HPI HPI Comments: Cassandra Ellis is a 34 y.o. female who presents to the Emergency Department complaining of 10/10 sharp left pelvic pain with onset 2-3 hours ago. Pt states she feels as though she needs to defecate. She states movement does not aggravate the pain. She took 4 x 200 mg ibuprofen without relief. Pt denies dysuria, hematuria, vaginal bleeding, vaginal discharge, nausea, vomiting and diarrhea.   Past Medical History  Diagnosis Date  . Thyroid disease    Past Surgical History  Procedure Laterality Date  . Tubal ligation     No family history on file. History  Substance Use Topics  . Smoking status: Never Smoker   . Smokeless tobacco: Never Used  . Alcohol Use: No   OB History    No data available     Review of Systems A complete 10 system review of systems was obtained and all systems are negative except as noted in the HPI and PMH.     Allergies  Milk-related compounds and Watermelon flavor  Home Medications   Prior to Admission medications   Not on File   BP 130/71 mmHg  Pulse 90  Temp(Src) 98.2 F (36.8 C) (Oral)  Resp 16  Ht 5' 1.5" (1.562 m)  Wt 164 lb (74.39 kg)  BMI 30.49 kg/m2  SpO2 100%  LMP 06/11/2015   Physical Exam General: Well-developed, well-nourished female in no acute distress or apparent discomfort; appearance consistent with age of record HENT: normocephalic; atraumatic Eyes: pupils equal, round and reactive to light; extraocular muscles intact Neck: supple Heart: regular rate and rhythm Lungs: clear to auscultation bilaterally Abdomen: soft; nondistended; left  suprapubic tenderness; no masses or hepatosplenomegaly; bowel sounds present GU: normal external genitalia; blood tinged mucoid cervical discharge; left adnexal tenderness Extremities: No deformity; full range of motion; pulses normal Neurologic: Awake, alert and oriented; motor function intact in all extremities and symmetric; no facial droop Skin: Warm and dry Psychiatric: Normal mood and affect   ED Course  Procedures (including critical care time) DIAGNOSTIC STUDIES: Oxygen Saturation is 99% on room air, normal by my interpretation.    COORDINATION OF CARE: 11:50 PM Discussed treatment plan with patient at beside, the patient agrees with the plan and has no further questions at this time.     MDM   Nursing notes and vitals signs, including pulse oximetry, reviewed.  Summary of this visit's results, reviewed by myself:  Labs:  Results for orders placed or performed during the hospital encounter of 06/24/15 (from the past 24 hour(s))  Pregnancy, urine     Status: None   Collection Time: 06/24/15 10:42 PM  Result Value Ref Range   Preg Test, Ur NEGATIVE NEGATIVE  Urinalysis, Routine w reflex microscopic (not at [facility])     Status: Abnormal   Collection Time: 06/24/15 10:42 PM  Result Value Ref Range   Color, Urine YELLOW YELLOW   APPearance CLOUDY (A) CLEAR   Specific Gravity, Urine 1.030 1.005 - 1.030   pH 6.0 5.0 - 8.0   Glucose, UA NEGATIVE NEGATIVE mg/dL  Hgb urine dipstick TRACE (A) NEGATIVE   Bilirubin Urine NEGATIVE NEGATIVE   Ketones, ur NEGATIVE NEGATIVE mg/dL   Protein, ur NEGATIVE NEGATIVE mg/dL   Urobilinogen, UA 1.0 0.0 - 1.0 mg/dL   Nitrite NEGATIVE NEGATIVE   Leukocytes, UA NEGATIVE NEGATIVE  Urine microscopic-add on     Status: Abnormal   Collection Time: 06/24/15 10:42 PM  Result Value Ref Range   Squamous Epithelial / LPF MANY (A) RARE   WBC, UA NONE SEEN <3 WBC/hpf   RBC / HPF 3-6 <3 RBC/hpf   Bacteria, UA MANY (A) RARE   Casts HYALINE CASTS (A)  NEGATIVE  Wet prep, genital     Status: Abnormal   Collection Time: 06/24/15 11:50 PM  Result Value Ref Range   Yeast Wet Prep HPF POC NONE SEEN NONE SEEN   Trich, Wet Prep NONE SEEN NONE SEEN   Clue Cells Wet Prep HPF POC FEW (A) NONE SEEN   WBC, Wet Prep HPF POC FEW (A) NONE SEEN    Imaging Studies: US Transvaginal Non-ob  July 24, 2015   CLINICAL DATA:  34 year old female with sudden onset left pelvic pain  EXAM: TRANSABDOMINAL AND TRANSVAGINAL ULTRASOUND OF PELVIS  DOPPLER ULTRASOUND OF OVARIES  TECHNIQUE: Both transabdominal and transvaginal ultrasound examinations of the pelvis were performed. Transabdominal technique was performed for global imaging of the pelvis including uterus, ovaries, adnexal regions, and pelvic cul-de-sac.  It was necessary to proceed with endovaginal exam following the transabdominal exam to visualize the endometrium and the ovaries. Color and duplex Doppler ultrasound was utilized to evaluate blood flow to the ovaries.  COMPARISON:  CT dated 07/27/2000  FINDINGS: Uterus  Measurements: The uterus is retroverted and measures 9.0 X 6.5 X 8.2 CM. The uterus is heterogeneous with linear artifact possibly related to adenomyosis. There is an ill-defined heterogeneous area in the body of the uterus most compatible with a fibroid. A cystic structure is noted in the lower uterus, likely in the bowel and cyst. Correlation with pregnancy test is recommended.  Endometrium  Thickness: The endometrium is ill-defined and measures approximately 15 mm in thickness.  Right ovary  Measurements: 3.7 x 1.9 x 2.5 cm. Normal appearance/no adnexal mass.  Left ovary  Measurements: 3.2 x 1.7 x 2.1 cm. Normal appearance/no adnexal mass.  Pulsed Doppler evaluation of both ovaries demonstrates normal low-resistance arterial and venous waveforms.  Other findings  No free fluid.  IMPRESSION: Heterogeneous uterus with possible underlying adenomyosis and focal uterine body fibroid.  Bilateral ovarian Doppler  flow.   Electronically Signed   By: Anner Crete M.D.   On: 07/24/2015 01:17   US Pelvis Complete  2015-07-24   CLINICAL DATA:  34 year old female with sudden onset left pelvic pain  EXAM: TRANSABDOMINAL AND TRANSVAGINAL ULTRASOUND OF PELVIS  DOPPLER ULTRASOUND OF OVARIES  TECHNIQUE: Both transabdominal and transvaginal ultrasound examinations of the pelvis were performed. Transabdominal technique was performed for global imaging of the pelvis including uterus, ovaries, adnexal regions, and pelvic cul-de-sac.  It was necessary to proceed with endovaginal exam following the transabdominal exam to visualize the endometrium and the ovaries. Color and duplex Doppler ultrasound was utilized to evaluate blood flow to the ovaries.  COMPARISON:  CT dated 07/27/2000  FINDINGS: Uterus  Measurements: The uterus is retroverted and measures 9.0 X 6.5 X 8.2 CM. The uterus is heterogeneous with linear artifact possibly related to adenomyosis. There is an ill-defined heterogeneous area in the body of the uterus most compatible with a fibroid. A cystic structure is  noted in the lower uterus, likely in the bowel and cyst. Correlation with pregnancy test is recommended.  Endometrium  Thickness: The endometrium is ill-defined and measures approximately 15 mm in thickness.  Right ovary  Measurements: 3.7 x 1.9 x 2.5 cm. Normal appearance/no adnexal mass.  Left ovary  Measurements: 3.2 x 1.7 x 2.1 cm. Normal appearance/no adnexal mass.  Pulsed Doppler evaluation of both ovaries demonstrates normal low-resistance arterial and venous waveforms.  Other findings  No free fluid.  IMPRESSION: Heterogeneous uterus with possible underlying adenomyosis and focal uterine body fibroid.  Bilateral ovarian Doppler flow.   Electronically Signed   By: Anner Crete M.D.   On: 06/25/2015 01:17   [place] Art/ven Flow Abd Pelv Doppler  06/25/2015   CLINICAL DATA:  34 year old female with sudden onset left pelvic pain  EXAM: TRANSABDOMINAL AND  TRANSVAGINAL ULTRASOUND OF PELVIS  DOPPLER ULTRASOUND OF OVARIES  TECHNIQUE: Both transabdominal and transvaginal ultrasound examinations of the pelvis were performed. Transabdominal technique was performed for global imaging of the pelvis including uterus, ovaries, adnexal regions, and pelvic cul-de-sac.  It was necessary to proceed with endovaginal exam following the transabdominal exam to visualize the endometrium and the ovaries. Color and duplex Doppler ultrasound was utilized to evaluate blood flow to the ovaries.  COMPARISON:  CT dated 07/27/2000  FINDINGS: Uterus  Measurements: The uterus is retroverted and measures 9.0 X 6.5 X 8.2 CM. The uterus is heterogeneous with linear artifact possibly related to adenomyosis. There is an ill-defined heterogeneous area in the body of the uterus most compatible with a fibroid. A cystic structure is noted in the lower uterus, likely in the bowel and cyst. Correlation with pregnancy test is recommended.  Endometrium  Thickness: The endometrium is ill-defined and measures approximately 15 mm in thickness.  Right ovary  Measurements: 3.7 x 1.9 x 2.5 cm. Normal appearance/no adnexal mass.  Left ovary  Measurements: 3.2 x 1.7 x 2.1 cm. Normal appearance/no adnexal mass.  Pulsed Doppler evaluation of both ovaries demonstrates normal low-resistance arterial and venous waveforms.  Other findings  No free fluid.  IMPRESSION: Heterogeneous uterus with possible underlying adenomyosis and focal uterine body fibroid.  Bilateral ovarian Doppler flow.   Electronically Signed   By: Anner Crete M.D.   On: 06/25/2015 01:17   Patient advised of ultrasound findings and need for follow-up. Will refer her to the OB/GYN clinic at [facility] as she has no physician presently.  I personally performed the services described in this documentation, which was scribed in my presence. The recorded information has been reviewed and is accurate.   Cassandra Rosser, MD 06/25/15 [phone]

## 2015-06-24 NOTE — ED Notes (Signed)
Pelvic pain x 2 hours-denies n/v/d, vaginal d/c, urinary s/s

## 2015-06-24 NOTE — ED Notes (Signed)
Water provided per request, pt unable to provide urine sample

## 2015-06-25 ENCOUNTER — Emergency Department (HOSPITAL_BASED_OUTPATIENT_CLINIC_OR_DEPARTMENT_OTHER): Payer: Medicaid Other

## 2015-06-25 LAB — WET PREP, GENITAL
TRICH WET PREP: NONE SEEN
YEAST WET PREP: NONE SEEN

## 2015-06-25 MED ORDER — HYDROCODONE-ACETAMINOPHEN 5-325 MG PO TABS: 1.0000 | ORAL_TABLET | Freq: Four times a day (QID) | ORAL | Status: DC | PRN

## 2015-06-25 NOTE — ED Notes (Signed)
Pt returned to room,

## 2015-06-26 LAB — GC/CHLAMYDIA PROBE AMP (~~LOC~~) NOT AT ARMC
Chlamydia: NEGATIVE
NEISSERIA GONORRHEA: NEGATIVE

## 2015-07-16 ENCOUNTER — Encounter: Payer: Self-pay | Admitting: Obstetrics & Gynecology

## 2015-08-04 ENCOUNTER — Encounter: Payer: Self-pay | Admitting: Obstetrics & Gynecology

## 2015-09-27 ENCOUNTER — Emergency Department (HOSPITAL_BASED_OUTPATIENT_CLINIC_OR_DEPARTMENT_OTHER): Payer: Medicaid Other

## 2015-09-27 ENCOUNTER — Encounter (HOSPITAL_BASED_OUTPATIENT_CLINIC_OR_DEPARTMENT_OTHER): Payer: Self-pay | Admitting: Adult Health

## 2015-09-27 ENCOUNTER — Emergency Department (HOSPITAL_BASED_OUTPATIENT_CLINIC_OR_DEPARTMENT_OTHER)
Admission: EM | Admit: 2015-09-27 | Discharge: 2015-09-27 | Disposition: A | Discharge status: Home / Self Care | Payer: Medicaid Other | Attending: Emergency Medicine | Admitting: Emergency Medicine

## 2015-09-27 DIAGNOSIS — E079 Disorder of thyroid, unspecified: Secondary | ICD-10-CM | POA: Diagnosis not present

## 2015-09-27 DIAGNOSIS — R079 Chest pain, unspecified: Secondary | ICD-10-CM | POA: Insufficient documentation

## 2015-09-27 DIAGNOSIS — Z8639 Personal history of other endocrine, nutritional and metabolic disease: Secondary | ICD-10-CM | POA: Diagnosis not present

## 2015-09-27 DIAGNOSIS — R0602 Shortness of breath: Secondary | ICD-10-CM | POA: Insufficient documentation

## 2015-09-27 DIAGNOSIS — Z79899 Other long term (current) drug therapy: Secondary | ICD-10-CM | POA: Diagnosis not present

## 2015-09-27 LAB — COMPREHENSIVE METABOLIC PANEL
ALBUMIN: 3.2 g/dL — AB (ref 3.5–5.0)
ALT: 25 U/L (ref 14–54)
AST: 26 U/L (ref 15–41)
Alkaline Phosphatase: 122 U/L (ref 38–126)
Anion gap: 3 — ABNORMAL LOW (ref 5–15)
BUN: 11 mg/dL (ref 6–20)
CALCIUM: 8.9 mg/dL (ref 8.9–10.3)
CO2: 24 mmol/L (ref 22–32)
Chloride: 110 mmol/L (ref 101–111)
Creatinine, Ser: <0.3 mg/dL — ABNORMAL LOW (ref 0.44–1.00)
Glucose, Bld: 119 mg/dL — ABNORMAL HIGH (ref 65–99)
Potassium: 3.9 mmol/L (ref 3.5–5.1)
SODIUM: 137 mmol/L (ref 135–145)
TOTAL PROTEIN: 6.3 g/dL — AB (ref 6.5–8.1)
Total Bilirubin: 0.5 mg/dL (ref 0.3–1.2)

## 2015-09-27 LAB — CBC WITH DIFFERENTIAL/PLATELET
Basophils Absolute: 0 10*3/uL (ref 0.0–0.1)
Basophils Relative: 0 %
EOS ABS: 0.3 10*3/uL (ref 0.0–0.7)
Eosinophils Relative: 5 %
HCT: 33.6 % — ABNORMAL LOW (ref 36.0–46.0)
Hemoglobin: 11.2 g/dL — ABNORMAL LOW (ref 12.0–15.0)
LYMPHS ABS: 2.2 10*3/uL (ref 0.7–4.0)
Lymphocytes Relative: 41 %
MCH: 24.1 pg — ABNORMAL LOW (ref 26.0–34.0)
MCHC: 33.3 g/dL (ref 30.0–36.0)
MCV: 72.4 fL — AB (ref 78.0–100.0)
MONO ABS: 0.9 10*3/uL (ref 0.1–1.0)
Monocytes Relative: 16 %
Neutro Abs: 2.1 10*3/uL (ref 1.7–7.7)
Neutrophils Relative %: 38 %
PLATELETS: 234 10*3/uL (ref 150–400)
RBC: 4.64 MIL/uL (ref 3.87–5.11)
RDW: 14.3 % (ref 11.5–15.5)
WBC: 5.5 10*3/uL (ref 4.0–10.5)

## 2015-09-27 LAB — TROPONIN I: Troponin I: <0.03 ng/mL (ref <0.031)

## 2015-09-27 LAB — LIPASE, BLOOD: LIPASE: 27 U/L (ref 22–51)

## 2015-09-27 LAB — D-DIMER, QUANTITATIVE: D-Dimer, Quant: <0.27 ug/mL-FEU (ref 0.00–0.48)

## 2015-09-27 MED ORDER — KETOROLAC TROMETHAMINE 30 MG/ML IJ SOLN
30.0000 mg | Freq: Once | INTRAMUSCULAR | Status: AC
  Administered 2015-09-27: 30 mg via INTRAVENOUS
  Filled 2015-09-27: qty 1

## 2015-09-27 MED ORDER — GI COCKTAIL ~~LOC~~
30.0000 mL | Freq: Once | ORAL | Status: AC
  Administered 2015-09-27: 30 mL via ORAL
  Filled 2015-09-27: qty 30

## 2015-09-27 MED ORDER — HYDROMORPHONE HCL 1 MG/ML IJ SOLN
1.0000 mg | Freq: Once | INTRAMUSCULAR | Status: AC
  Administered 2015-09-27: 1 mg via INTRAVENOUS
  Filled 2015-09-27: qty 1

## 2015-09-27 MED ORDER — FENTANYL CITRATE (PF) 100 MCG/2ML IJ SOLN
50.0000 ug | Freq: Once | INTRAMUSCULAR | Status: AC
  Administered 2015-09-27: 50 ug via INTRAVENOUS
  Filled 2015-09-27: qty 2

## 2015-09-27 NOTE — ED Provider Notes (Signed)
CSN: [id number]     Arrival date & time 09/27/15  5027 History   None    Chief Complaint  Patient presents with  . Chest Pain     (Consider location/radiation/quality/duration/timing/severity/associated sxs/prior Treatment) Patient is a 34 y.o. female presenting with chest pain.  Chest Pain Pain location:  Substernal area, L chest and R chest Pain quality: aching and pressure   Pain radiates to the back: no   Pain severity:  Mild Onset quality:  Sudden Duration:  2 hours Chronicity:  New Associated symptoms: shortness of breath   Associated symptoms: no abdominal pain, no cough, no fever, no nausea and not vomiting     Past Medical History  Diagnosis Date  . Thyroid disease    Past Surgical History  Procedure Laterality Date  . Tubal ligation     History reviewed. No pertinent family history. Social History  Substance Use Topics  . Smoking status: Never Smoker   . Smokeless tobacco: Never Used  . Alcohol Use: No   OB History    No data available     Review of Systems  Constitutional: Negative for fever and chills.  Respiratory: Positive for shortness of breath. Negative for cough, choking and chest tightness.   Cardiovascular: Positive for chest pain.  Gastrointestinal: Negative for nausea, vomiting, abdominal pain and diarrhea.  Skin: Negative for pallor and wound.  All other systems reviewed and are negative.     Allergies  Milk-related compounds and Watermelon flavor  Home Medications   Prior to Admission medications   Medication Sig Start Date End Date Taking? Authorizing Provider  methimazole (TAPAZOLE) 10 MG tablet Take 10 mg by mouth 3 (three) times daily.   Yes Historical Provider, MD  Vitamin D, Ergocalciferol, (DRISDOL) 50000 UNITS CAPS capsule Take 50,000 Units by mouth every 7 (seven) days.   Yes Historical Provider, MD  HYDROcodone-acetaminophen (NORCO/VICODIN) 5-325 MG per tablet Take 1-2 tablets by mouth every 6 (six) hours as needed.  06/25/15   John Molpus, MD   BP 110/55 mmHg  Pulse 88  Temp(Src) 97.9 F (36.6 C) (Oral)  Resp 18  Ht 5\' 1"  (1.549 m)  Wt 166 lb (75.297 kg)  BMI 31.38 kg/m2  SpO2 100%  LMP 09/09/2015 (Approximate) Physical Exam  Constitutional: She is oriented to person, place, and time. She appears well-developed and well-nourished.  HENT:  Head: Normocephalic and atraumatic.  Eyes: Conjunctivae and EOM are normal. Right eye exhibits no discharge. Left eye exhibits no discharge.  Cardiovascular: Normal rate and regular rhythm.   Pulmonary/Chest: Effort normal and breath sounds normal. No respiratory distress. She exhibits tenderness (left sternal border).  Abdominal: Soft. She exhibits no distension. There is no tenderness. There is no rebound.  Musculoskeletal: Normal range of motion. She exhibits no edema or tenderness.  Neurological: She is alert and oriented to person, place, and time.  Skin: Skin is warm and dry.  Nursing note and vitals reviewed.   ED Course  Procedures (including critical care time) Labs Review Labs Reviewed  CBC WITH DIFFERENTIAL/PLATELET - Abnormal; Notable for the following:    Hemoglobin 11.2 (*)    HCT 33.6 (*)    MCV 72.4 (*)    MCH 24.1 (*)    All other components within normal limits  COMPREHENSIVE METABOLIC PANEL - Abnormal; Notable for the following:    Glucose, Bld 119 (*)    Creatinine, Ser <0.30 (*)    Total Protein 6.3 (*)    Albumin 3.2 (*)  Anion gap 3 (*)    All other components within normal limits  LIPASE, BLOOD  D-DIMER, QUANTITATIVE (NOT AT [facility])  TROPONIN I  TROPONIN I    Imaging Review Dg Chest 2 View  09/27/2015  CLINICAL DATA:  Chest pain and shortness of breath. Acute onset of pain while patient from sleep. Pain is constant. 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 EXAM: CHEST  2 VIEW COMPARISON:  None. FINDINGS: Normal heart size and pulmonary vascularity. No focal airspace disease or consolidation in the  lungs. No blunting of costophrenic angles. No pneumothorax. Mediastinal contours appear intact. IMPRESSION: No active cardiopulmonary disease. Electronically Signed   By: Lucienne Capers M.D.   On: 09/27/2015 04:59   I have personally reviewed and evaluated these images and lab results as part of my medical decision-making.   EKG Interpretation None      MDM   Final diagnoses:  Chest pain, unspecified chest pain type   34 yo F w/ atypical chest pain but not normal ECG. Likely reflux (belching made pain better) but delta troponins done and negative. No murmur or e/o HOCM on ecg/exam. No e/o infectious cause. Doubt traumatic cause.  I have personally and contemperaneously reviewed labs and imaging and used in my decision making as above.   A medical screening exam was performed and I feel the patient has had an appropriate workup for their chief complaint at this time and likelihood of emergent condition existing is low. They have been counseled on decision, discharge, follow up and which symptoms necessitate immediate return to the emergency department. They or their family verbally stated understanding and agreement with plan and discharged in stable condition.      Merrily Pew, MD 09/28/15 [phone]

## 2015-09-27 NOTE — ED Notes (Signed)
Presents with onset of left sided chest pain-unable to describe-pain woke patient from sleep associated with SOB and worse pain when taking deep breath. Pain is constant. Nothing makes pain better. Denies nausea. Breath sounds clear. Hx of enlarged left ventricle from ECHO done on Friday.

## 2015-10-02 ENCOUNTER — Other Ambulatory Visit: Payer: Self-pay | Admitting: Physician Assistant

## 2015-10-02 DIAGNOSIS — R102 Pelvic and perineal pain: Secondary | ICD-10-CM

## 2015-10-06 ENCOUNTER — Ambulatory Visit
Admission: RE | Admit: 2015-10-06 | Discharge: 2015-10-06 | Disposition: A | Discharge status: Home / Self Care | Payer: Medicaid Other | Source: Ambulatory Visit | Attending: Physician Assistant | Admitting: Physician Assistant

## 2015-10-06 DIAGNOSIS — R102 Pelvic and perineal pain: Secondary | ICD-10-CM

## 2015-10-23 ENCOUNTER — Encounter (HOSPITAL_BASED_OUTPATIENT_CLINIC_OR_DEPARTMENT_OTHER): Payer: Self-pay

## 2015-10-23 ENCOUNTER — Emergency Department (HOSPITAL_BASED_OUTPATIENT_CLINIC_OR_DEPARTMENT_OTHER)
Admission: EM | Admit: 2015-10-23 | Discharge: 2015-10-23 | Disposition: A | Discharge status: Home / Self Care | Payer: Medicaid Other | Attending: Emergency Medicine | Admitting: Emergency Medicine

## 2015-10-23 DIAGNOSIS — Z8639 Personal history of other endocrine, nutritional and metabolic disease: Secondary | ICD-10-CM | POA: Insufficient documentation

## 2015-10-23 DIAGNOSIS — R112 Nausea with vomiting, unspecified: Secondary | ICD-10-CM

## 2015-10-23 DIAGNOSIS — Z79899 Other long term (current) drug therapy: Secondary | ICD-10-CM | POA: Diagnosis not present

## 2015-10-23 DIAGNOSIS — Z3202 Encounter for pregnancy test, result negative: Secondary | ICD-10-CM | POA: Diagnosis not present

## 2015-10-23 LAB — CBC WITH DIFFERENTIAL/PLATELET
BASOS ABS: 0 10*3/uL (ref 0.0–0.1)
BASOS PCT: 0 %
Eosinophils Absolute: 0.1 10*3/uL (ref 0.0–0.7)
Eosinophils Relative: 2 %
HCT: 35.4 % — ABNORMAL LOW (ref 36.0–46.0)
HEMOGLOBIN: 11.9 g/dL — AB (ref 12.0–15.0)
Lymphocytes Relative: 29 %
Lymphs Abs: 1.2 10*3/uL (ref 0.7–4.0)
MCH: 23.8 pg — ABNORMAL LOW (ref 26.0–34.0)
MCHC: 33.6 g/dL (ref 30.0–36.0)
MCV: 70.9 fL — AB (ref 78.0–100.0)
MONO ABS: 1 10*3/uL (ref 0.1–1.0)
Monocytes Relative: 24 %
Neutro Abs: 1.7 10*3/uL (ref 1.7–7.7)
Neutrophils Relative %: 45 %
Platelets: 238 10*3/uL (ref 150–400)
RBC: 4.99 MIL/uL (ref 3.87–5.11)
RDW: 14.3 % (ref 11.5–15.5)
WBC: 4 10*3/uL (ref 4.0–10.5)

## 2015-10-23 LAB — URINALYSIS, ROUTINE W REFLEX MICROSCOPIC
Glucose, UA: NEGATIVE mg/dL
LEUKOCYTES UA: NEGATIVE
NITRITE: NEGATIVE
PH: 5.5 (ref 5.0–8.0)
PROTEIN: NEGATIVE mg/dL
Specific Gravity, Urine: 1.028 (ref 1.005–1.030)
UROBILINOGEN UA: 1 mg/dL (ref 0.0–1.0)

## 2015-10-23 LAB — BASIC METABOLIC PANEL
ANION GAP: 9 (ref 5–15)
BUN: 12 mg/dL (ref 6–20)
CHLORIDE: 106 mmol/L (ref 101–111)
CO2: 22 mmol/L (ref 22–32)
Calcium: 9.1 mg/dL (ref 8.9–10.3)
Creatinine, Ser: <0.3 mg/dL — ABNORMAL LOW (ref 0.44–1.00)
Glucose, Bld: 100 mg/dL — ABNORMAL HIGH (ref 65–99)
POTASSIUM: 3.6 mmol/L (ref 3.5–5.1)
Sodium: 137 mmol/L (ref 135–145)

## 2015-10-23 LAB — PREGNANCY, URINE: PREG TEST UR: NEGATIVE

## 2015-10-23 LAB — URINE MICROSCOPIC-ADD ON

## 2015-10-23 MED ORDER — ONDANSETRON 4 MG PO TBDP: 4.0000 mg | ORAL_TABLET | Freq: Three times a day (TID) | ORAL | Status: DC | PRN

## 2015-10-23 MED ORDER — ONDANSETRON HCL 4 MG/2ML IJ SOLN
4.0000 mg | Freq: Once | INTRAMUSCULAR | Status: AC
  Administered 2015-10-23: 4 mg via INTRAVENOUS
  Filled 2015-10-23: qty 2

## 2015-10-23 MED ORDER — SODIUM CHLORIDE 0.9 % IV BOLUS (SEPSIS)
1000.0000 mL | Freq: Once | INTRAVENOUS | Status: AC
  Administered 2015-10-23: 1000 mL via INTRAVENOUS

## 2015-10-23 NOTE — ED Notes (Signed)
Patient transported to X-ray 

## 2015-10-23 NOTE — ED Notes (Signed)
Reports vomiting x 4 days.  Also reports headache.

## 2015-10-23 NOTE — Discharge Instructions (Signed)

## 2015-10-23 NOTE — ED Provider Notes (Signed)
CSN: [id number]     Arrival date & time 10/23/15  1753 History   First MD Initiated Contact with Patient 10/23/15 1805     Chief Complaint  Patient presents with  . Emesis     (Consider location/radiation/quality/duration/timing/severity/associated sxs/prior Treatment) HPI Comments: Pt comes in with c/o persistent vomiting for the last 4 days. Had some mild epigastric pain initially but that has resolved. Denies fever, diarrhea, dysuria or constipation. She states that she has developed a headache in the last couple of day.  The history is provided by the patient. No language interpreter was used.    Past Medical History  Diagnosis Date  . Thyroid disease    Past Surgical History  Procedure Laterality Date  . Tubal ligation    . Cesarean section     No family history on file. Social History  Substance Use Topics  . Smoking status: Never Smoker   . Smokeless tobacco: Never Used  . Alcohol Use: No   OB History    No data available     Review of Systems  All other systems reviewed and are negative.     Allergies  Milk-related compounds and Watermelon flavor  Home Medications   Prior to Admission medications   Medication Sig Start Date End Date Taking? Authorizing Provider  methimazole (TAPAZOLE) 10 MG tablet Take 10 mg by mouth 3 (three) times daily.   Yes Historical Provider, MD  Vitamin D, Ergocalciferol, (DRISDOL) 50000 UNITS CAPS capsule Take 50,000 Units by mouth every 7 (seven) days.   Yes Historical Provider, MD  HYDROcodone-acetaminophen (NORCO/VICODIN) 5-325 MG per tablet Take 1-2 tablets by mouth every 6 (six) hours as needed. 06/25/15   John Molpus, MD   BP 148/89 mmHg  Pulse 110  Temp(Src) 98.3 F (36.8 C) (Oral)  Resp 18  Ht 5\' 1"  (1.549 m)  Wt 163 lb (73.936 kg)  BMI 30.81 kg/m2  SpO2 98%  LMP 09/30/2015 Physical Exam  Constitutional: She is oriented to person, place, and time. She appears well-developed and well-nourished.  HENT:  Head:  Normocephalic and atraumatic.  Cardiovascular: Normal rate and regular rhythm.   Pulmonary/Chest: Effort normal and breath sounds normal.  Abdominal: Soft. Bowel sounds are normal. There is no tenderness.  Musculoskeletal: Normal range of motion.  Neurological: She is alert and oriented to person, place, and time.  Skin: Skin is warm and dry.  Psychiatric: She has a normal mood and affect.  Nursing note and vitals reviewed.   ED Course  Procedures (including critical care time) Labs Review Labs Reviewed  URINALYSIS, ROUTINE W REFLEX MICROSCOPIC (NOT AT [facility]) - Abnormal; Notable for the following:    Color, Urine AMBER (*)    Hgb urine dipstick SMALL (*)    Bilirubin Urine SMALL (*)    Ketones, ur >80 (*)    All other components within normal limits  CBC WITH DIFFERENTIAL/PLATELET - Abnormal; Notable for the following:    Hemoglobin 11.9 (*)    HCT 35.4 (*)    MCV 70.9 (*)    MCH 23.8 (*)    All other components within normal limits  BASIC METABOLIC PANEL - Abnormal; Notable for the following:    Glucose, Bld 100 (*)    Creatinine, Ser <0.30 (*)    All other components within normal limits  PREGNANCY, URINE  URINE MICROSCOPIC-ADD ON    Imaging Review No results found. I have personally reviewed and evaluated these images and lab results as part of my medical decision-making.  EKG Interpretation None      MDM   Final diagnoses:  Nausea and vomiting, vomiting of unspecified type    Pt is feeling better after fluids and zofran. Pt is tolerating po. Will send home with zofran. Don't think any imaging is needed at this time    Glendell Docker, NP 10/23/15 1953  Davonna Belling, MD 10/23/15 [phone]

## 2015-12-26 ENCOUNTER — Emergency Department (HOSPITAL_COMMUNITY)
Admission: EM | Admit: 2015-12-26 | Discharge: 2015-12-27 | Disposition: A | Discharge status: Home / Self Care | Payer: Medicaid Other | Attending: Emergency Medicine | Admitting: Emergency Medicine

## 2015-12-26 ENCOUNTER — Encounter (HOSPITAL_COMMUNITY): Payer: Self-pay | Admitting: Emergency Medicine

## 2015-12-26 DIAGNOSIS — R1013 Epigastric pain: Secondary | ICD-10-CM | POA: Diagnosis not present

## 2015-12-26 DIAGNOSIS — E0500 Thyrotoxicosis with diffuse goiter without thyrotoxic crisis or storm: Secondary | ICD-10-CM | POA: Diagnosis not present

## 2015-12-26 DIAGNOSIS — Z79899 Other long term (current) drug therapy: Secondary | ICD-10-CM | POA: Diagnosis not present

## 2015-12-26 DIAGNOSIS — R630 Anorexia: Secondary | ICD-10-CM | POA: Diagnosis not present

## 2015-12-26 DIAGNOSIS — R112 Nausea with vomiting, unspecified: Secondary | ICD-10-CM | POA: Diagnosis not present

## 2015-12-26 LAB — URINALYSIS, ROUTINE W REFLEX MICROSCOPIC
BILIRUBIN URINE: NEGATIVE
GLUCOSE, UA: NEGATIVE mg/dL
Ketones, ur: 40 mg/dL — AB
Leukocytes, UA: NEGATIVE
Nitrite: NEGATIVE
Protein, ur: NEGATIVE mg/dL
SPECIFIC GRAVITY, URINE: 1.034 — AB (ref 1.005–1.030)
pH: 6 (ref 5.0–8.0)

## 2015-12-26 LAB — COMPREHENSIVE METABOLIC PANEL
ALBUMIN: 3.9 g/dL (ref 3.5–5.0)
ALK PHOS: 185 U/L — AB (ref 38–126)
ALT: 46 U/L (ref 14–54)
AST: 93 U/L — ABNORMAL HIGH (ref 15–41)
Anion gap: 11 (ref 5–15)
BUN: 16 mg/dL (ref 6–20)
CHLORIDE: 107 mmol/L (ref 101–111)
CO2: 22 mmol/L (ref 22–32)
Calcium: 9.4 mg/dL (ref 8.9–10.3)
GLUCOSE: 108 mg/dL — AB (ref 65–99)
Potassium: 3.8 mmol/L (ref 3.5–5.1)
SODIUM: 140 mmol/L (ref 135–145)
Total Bilirubin: 1.7 mg/dL — ABNORMAL HIGH (ref 0.3–1.2)
Total Protein: 7.2 g/dL (ref 6.5–8.1)

## 2015-12-26 LAB — CBC
HCT: 37.1 % (ref 36.0–46.0)
HEMOGLOBIN: 12.3 g/dL (ref 12.0–15.0)
MCH: 24.4 pg — AB (ref 26.0–34.0)
MCHC: 33.2 g/dL (ref 30.0–36.0)
MCV: 73.6 fL — AB (ref 78.0–100.0)
PLATELETS: 238 10*3/uL (ref 150–400)
RBC: 5.04 MIL/uL (ref 3.87–5.11)
RDW: 14.1 % (ref 11.5–15.5)
WBC: 5.6 10*3/uL (ref 4.0–10.5)

## 2015-12-26 LAB — URINE MICROSCOPIC-ADD ON

## 2015-12-26 LAB — LIPASE, BLOOD: LIPASE: 18 U/L (ref 11–51)

## 2015-12-26 NOTE — ED Notes (Signed)
Patient presents via EMS for abdominal pain, N/V, approximately 2045. Reports pain after eating broccoli salad. C/o sharp pain in epigastric area, 4-5 episodes of emesis. Vicodin/ibuprofen and zofran at home. No relief from pain.   Last Vs: 180/100, 120hr, 16resp, 98%ra, 99.5t

## 2015-12-27 MED ORDER — OMEPRAZOLE 20 MG PO CPDR: 20.0000 mg | DELAYED_RELEASE_CAPSULE | Freq: Every day | ORAL | Status: DC

## 2015-12-27 MED ORDER — GI COCKTAIL ~~LOC~~
30.0000 mL | Freq: Once | ORAL | Status: AC
  Administered 2015-12-27: 30 mL via ORAL
  Filled 2015-12-27: qty 30

## 2015-12-27 NOTE — Discharge Instructions (Signed)

## 2015-12-27 NOTE — ED Provider Notes (Signed)
CSN: [id number]     Arrival date & time 12/26/15  2122 History  By signing my name below, I, Irene Pap, attest that this documentation has been prepared under the direction and in the presence of Leo Grosser, MD. Electronically Signed: Irene Pap, ED Scribe. 12/27/2015. 12:59 AM.  Chief Complaint  Patient presents with  . Abdominal Pain  . Emesis   Patient is a 35 y.o. female presenting with abdominal pain and vomiting. The history is provided by the patient. No language interpreter was used.  Abdominal Pain Pain location:  Epigastric Pain quality: sharp   Pain radiates to:  Does not radiate Pain severity:  Moderate Onset quality:  Sudden Duration:  5 hours Timing:  Constant Progression:  Worsening Chronicity:  New Context: eating   Ineffective treatments:  OTC medications (Zofran) Associated symptoms: nausea and vomiting   Associated symptoms: no diarrhea   Emesis Associated symptoms: abdominal pain   Associated symptoms: no diarrhea    HPI Comments: Cassandra Ellis is a 35 y.o. Female with a hx of Graves thyroid disease brought in by EMS who presents to the Emergency Department complaining of sharp, central epigastric pain onset 5 hours ago. She states that she was experiencing "hot flashes" while she was at work, came home with a decreased appetite and only ate broccoli salad which worsened her pain. She states that she took her thyroid medication, Zofran, and pain medication and immediately began to vomit, having 6 episodes of emesis total. She reports hx of GERD. She denies diarrhea.  PCP: Benito Mccreedy, MD EMS vitals: 180/100, 120hr, 16resp, 98%ra, 99.5t  Past Medical History  Diagnosis Date  . Thyroid disease    Past Surgical History  Procedure Laterality Date  . Tubal ligation    . Cesarean section     No family history on file. Social History  Substance Use Topics  . Smoking status: Never Smoker   . Smokeless tobacco: Never Used  . Alcohol Use: No    OB History    No data available     Review of Systems  Constitutional: Positive for appetite change.  Gastrointestinal: Positive for nausea, vomiting and abdominal pain. Negative for diarrhea.  All other systems reviewed and are negative.  Allergies  Milk-related compounds and Watermelon flavor  Home Medications   Prior to Admission medications   Medication Sig Start Date End Date Taking? Authorizing Provider  HYDROcodone-acetaminophen (NORCO/VICODIN) 5-325 MG per tablet Take 1-2 tablets by mouth every 6 (six) hours as needed. 06/25/15   John Molpus, MD  methimazole (TAPAZOLE) 10 MG tablet Take 10 mg by mouth 3 (three) times daily.    Historical Provider, MD  ondansetron (ZOFRAN ODT) 4 MG disintegrating tablet Take 1 tablet (4 mg total) by mouth every 8 (eight) hours as needed for nausea or vomiting. 10/23/15   Glendell Docker, NP  Vitamin D, Ergocalciferol, (DRISDOL) 50000 UNITS CAPS capsule Take 50,000 Units by mouth every 7 (seven) days.    Historical Provider, MD   BP 127/72 mmHg  Pulse 80  Temp(Src) 98.6 F (37 C) (Oral)  Resp 16  SpO2 100%  LMP 12/26/2015 Physical Exam  Constitutional: She is oriented to person, place, and time. She appears well-developed and well-nourished. No distress.  HENT:  Head: Normocephalic and atraumatic.  Mouth/Throat: Oropharynx is clear and moist. No oropharyngeal exudate.  Trachea midline  Eyes: Conjunctivae and EOM are normal. Pupils are equal, round, and reactive to light.  Neck: Trachea normal and normal range of motion. Neck supple. No  JVD present. Carotid bruit is not present.  Cardiovascular: Normal rate and regular rhythm.  Exam reveals no gallop and no friction rub.   No murmur heard. Pulmonary/Chest: Effort normal and breath sounds normal. No stridor. She has no wheezes. She has no rales.  Abdominal: Soft. Bowel sounds are normal. She exhibits no mass. There is tenderness. There is no rebound and no guarding.  Minimal epigastric  tenderness  Musculoskeletal: Normal range of motion.  Lymphadenopathy:    She has no cervical adenopathy.  Neurological: She is alert and oriented to person, place, and time. She has normal reflexes. No cranial nerve deficit. She exhibits normal muscle tone. Coordination normal.  Cranial nerves 2-12 intact  Skin: Skin is warm and dry. She is not diaphoretic.  Psychiatric: She has a normal mood and affect. Her behavior is normal.    ED Course  Procedures (including critical care time) DIAGNOSTIC STUDIES: Oxygen Saturation is 100% on RA, normal by my interpretation.    COORDINATION OF CARE: 12:59 AM-Discussed treatment plan which includes labs with pt at bedside and pt agreed to plan.    Labs Review Labs Reviewed  COMPREHENSIVE METABOLIC PANEL - Abnormal; Notable for the following:    Glucose, Bld 108 (*)    Creatinine, Ser <0.30 (*)    AST 93 (*)    Alkaline Phosphatase 185 (*)    Total Bilirubin 1.7 (*)    All other components within normal limits  CBC - Abnormal; Notable for the following:    MCV 73.6 (*)    MCH 24.4 (*)    All other components within normal limits  URINALYSIS, ROUTINE W REFLEX MICROSCOPIC (NOT AT [facility]) - Abnormal; Notable for the following:    Color, Urine AMBER (*)    Specific Gravity, Urine 1.034 (*)    Hgb urine dipstick TRACE (*)    Ketones, ur 40 (*)    All other components within normal limits  URINE MICROSCOPIC-ADD ON - Abnormal; Notable for the following:    Squamous Epithelial / LPF 0-5 (*)    Bacteria, UA RARE (*)    All other components within normal limits  LIPASE, BLOOD    Imaging Review No results found. I have personally reviewed and evaluated these images and lab results as part of my medical decision-making.   EKG Interpretation None      MDM   Final diagnoses:  Non-intractable vomiting with nausea, vomiting of unspecified type  Epigastric pain    35 y.o. female presents with abdominal pain after feeling nauseated at  home and vomiting several times. Had prodromal "hot flashes" at work and has lost appetite recently. AFVSS and otherwise well appearing. No RUQ tenderness and no typical modifying factors to suggest biliary pathology. No longer vomiting. Suspect viral vs food related illness. Minimally elevated liver enzymes should be monitored on an outpatient basis. Plan to follow up with PCP as needed and return precautions discussed for worsening or new concerning symptoms.    I personally performed the services described in this documentation, which was scribed in my presence. The recorded information has been reviewed and is accurate.      Leo Grosser, MD 12/27/15 [phone]

## 2016-05-26 ENCOUNTER — Encounter (HOSPITAL_BASED_OUTPATIENT_CLINIC_OR_DEPARTMENT_OTHER): Payer: Self-pay

## 2016-05-26 ENCOUNTER — Emergency Department (HOSPITAL_BASED_OUTPATIENT_CLINIC_OR_DEPARTMENT_OTHER)
Admission: EM | Admit: 2016-05-26 | Discharge: 2016-05-26 | Disposition: A | Discharge status: Home / Self Care | Payer: Medicaid Other | Attending: Emergency Medicine | Admitting: Emergency Medicine

## 2016-05-26 DIAGNOSIS — Z79899 Other long term (current) drug therapy: Secondary | ICD-10-CM | POA: Insufficient documentation

## 2016-05-26 DIAGNOSIS — N939 Abnormal uterine and vaginal bleeding, unspecified: Secondary | ICD-10-CM | POA: Diagnosis not present

## 2016-05-26 LAB — COMPREHENSIVE METABOLIC PANEL
ALT: 25 U/L (ref 14–54)
AST: 21 U/L (ref 15–41)
Albumin: 3.3 g/dL — ABNORMAL LOW (ref 3.5–5.0)
Alkaline Phosphatase: 137 U/L — ABNORMAL HIGH (ref 38–126)
Anion gap: 6 (ref 5–15)
BUN: 10 mg/dL (ref 6–20)
CO2: 23 mmol/L (ref 22–32)
Calcium: 8.9 mg/dL (ref 8.9–10.3)
Chloride: 109 mmol/L (ref 101–111)
Creatinine, Ser: <0.3 mg/dL — ABNORMAL LOW (ref 0.44–1.00)
Glucose, Bld: 107 mg/dL — ABNORMAL HIGH (ref 65–99)
Potassium: 3.9 mmol/L (ref 3.5–5.1)
Sodium: 138 mmol/L (ref 135–145)
Total Bilirubin: 0.7 mg/dL (ref 0.3–1.2)
Total Protein: 6.5 g/dL (ref 6.5–8.1)

## 2016-05-26 LAB — URINALYSIS, ROUTINE W REFLEX MICROSCOPIC
Bilirubin Urine: NEGATIVE
Glucose, UA: NEGATIVE mg/dL
KETONES UR: NEGATIVE mg/dL
Leukocytes, UA: NEGATIVE
Nitrite: NEGATIVE
PH: 7.5 (ref 5.0–8.0)
PROTEIN: NEGATIVE mg/dL
SPECIFIC GRAVITY, URINE: 1.017 (ref 1.005–1.030)

## 2016-05-26 LAB — CBC WITH DIFFERENTIAL/PLATELET
Basophils Absolute: 0 10*3/uL (ref 0.0–0.1)
Basophils Relative: 0 %
Eosinophils Absolute: 0.2 10*3/uL (ref 0.0–0.7)
Eosinophils Relative: 4 %
HCT: 33.5 % — ABNORMAL LOW (ref 36.0–46.0)
Hemoglobin: 11.3 g/dL — ABNORMAL LOW (ref 12.0–15.0)
Lymphocytes Relative: 44 %
Lymphs Abs: 2.2 10*3/uL (ref 0.7–4.0)
MCH: 24.5 pg — ABNORMAL LOW (ref 26.0–34.0)
MCHC: 33.7 g/dL (ref 30.0–36.0)
MCV: 72.5 fL — ABNORMAL LOW (ref 78.0–100.0)
Monocytes Absolute: 0.7 10*3/uL (ref 0.1–1.0)
Monocytes Relative: 13 %
Neutro Abs: 2 10*3/uL (ref 1.7–7.7)
Neutrophils Relative %: 39 %
Platelets: 213 10*3/uL (ref 150–400)
RBC: 4.62 MIL/uL (ref 3.87–5.11)
RDW: 14.2 % (ref 11.5–15.5)
WBC: 5.1 10*3/uL (ref 4.0–10.5)

## 2016-05-26 LAB — WET PREP, GENITAL
Clue Cells Wet Prep HPF POC: NONE SEEN
SPERM: NONE SEEN
Trich, Wet Prep: NONE SEEN
Yeast Wet Prep HPF POC: NONE SEEN

## 2016-05-26 LAB — PREGNANCY, URINE: Preg Test, Ur: NEGATIVE

## 2016-05-26 LAB — URINE MICROSCOPIC-ADD ON: Bacteria, UA: NONE SEEN

## 2016-05-26 MED ORDER — KETOROLAC TROMETHAMINE 15 MG/ML IJ SOLN
15.0000 mg | Freq: Once | INTRAMUSCULAR | Status: AC
  Administered 2016-05-26: 15 mg via INTRAMUSCULAR

## 2016-05-26 MED ORDER — KETOROLAC TROMETHAMINE 15 MG/ML IJ SOLN
15.0000 mg | Freq: Once | INTRAMUSCULAR | Status: DC
  Filled 2016-05-26: qty 1

## 2016-05-26 NOTE — ED Notes (Signed)
Pt given d/c instructions as per chart. Verbalizes understanding. No questions. 

## 2016-05-26 NOTE — ED Provider Notes (Signed)
CSN: [id number]     Arrival date & time 05/26/16  1253 History   First MD Initiated Contact with Patient 05/26/16 1424     Chief Complaint  Patient presents with  . Vaginal Bleeding    HPI   35 year old female presents today with complaints of vaginal bleeding. Patient reports symptoms started approximately 3 weeks ago with her normal menstrual cycle. This was very light, last and only a few days, but then returned again a week later. She reports this was again light for several days and then again resolved. She notes that over the last 2 days she had a return of bleeding, this was heavier, with continuation today. Patient reports she's used approximately 9 tampons today. Patient denies any history of the same, reports she is sexually active, denies any vaginal discharge. She denies any abdominal pain, bleeding disorders, any abnormal bruising or bleeding. Patient has OB/GYN, but has not followed up with. Patient reports she is also having a very mild headache, typical of previous, no red flags.    Past Medical History  Diagnosis Date  . Thyroid disease    Past Surgical History  Procedure Laterality Date  . Tubal ligation    . Cesarean section     No family history on file. Social History  Substance Use Topics  . Smoking status: Never Smoker   . Smokeless tobacco: Never Used  . Alcohol Use: No   OB History    No data available     Review of Systems  All other systems reviewed and are negative.   Allergies  Milk-related compounds and Watermelon flavor  Home Medications   Prior to Admission medications   Medication Sig Start Date End Date Taking? Authorizing Provider  propylthiouracil (PTU) 50 MG tablet Take 50 mg by mouth 3 (three) times daily.   Yes Historical Provider, MD  omeprazole (PRILOSEC) 20 MG capsule Take 1 capsule (20 mg total) by mouth daily. 12/27/15   Leo Grosser, MD   BP 143/85 mmHg  Pulse 96  Temp(Src) 98.6 F (37 C) (Oral)  Resp 20  Ht 5\' 1"  (1.549  m)  Wt 76.794 kg  BMI 32.01 kg/m2  SpO2 100%  LMP 05/24/2016   Physical Exam  Constitutional: She is oriented to person, place, and time. She appears well-developed and well-nourished.  HENT:  Head: Normocephalic and atraumatic.  Eyes: Conjunctivae are normal. Pupils are equal, round, and reactive to light. Right eye exhibits no discharge. Left eye exhibits no discharge. No scleral icterus.  Neck: Normal range of motion. No JVD present. No tracheal deviation present.  Pulmonary/Chest: Effort normal. No stridor.  Genitourinary: Cervix exhibits no motion tenderness, no discharge and no friability. Right adnexum displays no mass, no tenderness and no fullness. Left adnexum displays no mass, no tenderness and no fullness. There is bleeding in the vagina. No erythema or tenderness in the vagina. No foreign body around the vagina. No signs of injury around the vagina. No vaginal discharge found.  Musculoskeletal: Normal range of motion. She exhibits no edema or tenderness.  Neurological: She is alert and oriented to person, place, and time. She has normal strength. No cranial nerve deficit or sensory deficit. Coordination normal. GCS eye subscore is 4. GCS verbal subscore is 5. GCS motor subscore is 6.  Psychiatric: She has a normal mood and affect. Her behavior is normal. Judgment and thought content normal.  Nursing note and vitals reviewed.   ED Course  Procedures (including critical care time) Labs Review Labs  Reviewed  WET PREP, GENITAL - Abnormal; Notable for the following:    WBC, Wet Prep HPF POC FEW (*)    All other components within normal limits  URINALYSIS, ROUTINE W REFLEX MICROSCOPIC (NOT AT [facility]) - Abnormal; Notable for the following:    Hgb urine dipstick SMALL (*)    All other components within normal limits  URINE MICROSCOPIC-ADD ON - Abnormal; Notable for the following:    Squamous Epithelial / LPF 0-5 (*)    All other components within normal limits  CBC WITH  DIFFERENTIAL/PLATELET - Abnormal; Notable for the following:    Hemoglobin 11.3 (*)    HCT 33.5 (*)    MCV 72.5 (*)    MCH 24.5 (*)    All other components within normal limits  COMPREHENSIVE METABOLIC PANEL - Abnormal; Notable for the following:    Glucose, Bld 107 (*)    Creatinine, Ser <0.30 (*)    Albumin 3.3 (*)    Alkaline Phosphatase 137 (*)    All other components within normal limits  PREGNANCY, URINE  GC/CHLAMYDIA PROBE AMP ([place]) NOT AT [facility]    Imaging Review No results found. I have personally reviewed and evaluated these images and lab results as part of my medical decision-making.   EKG Interpretation None      MDM   Final diagnoses:  Vaginal bleeding    Labs: Wet prep, CBC, CMP, urinalysis, GC  Imaging:  Consults:   Therapeutics:  Discharge Meds:   Assessment/Plan: Patient presents with vaginal bleeding. Likely dysfunctional uterine bleeding. Patient has very minimal bleeding on exam, hemoglobin 11.3, and reassuring vital signs. She has no abnormalities on her laboratory analysis, no history of bleeding disorders. Patient will need OB/GYN follow-up, she is instructed to monitor for worsening signs or symptoms and return immediately if any present. Patient verbalized understanding and agreement today's plan.  Patient also has vague headache, history of the same, no red flags.       Okey Regal, PA-C 05/26/16 1627  Harvel Quale, MD 06/02/16 [phone]

## 2016-05-26 NOTE — ED Notes (Addendum)
Heavy vaginal bleeding since 4am-started bleeding x 2 days for third time within the month-NAD-steady gait

## 2016-05-26 NOTE — Discharge Instructions (Signed)

## 2016-05-27 LAB — GC/CHLAMYDIA PROBE AMP (~~LOC~~) NOT AT ARMC
CHLAMYDIA, DNA PROBE: NEGATIVE
Neisseria Gonorrhea: NEGATIVE

## 2016-07-21 ENCOUNTER — Emergency Department (HOSPITAL_BASED_OUTPATIENT_CLINIC_OR_DEPARTMENT_OTHER)
Admission: EM | Admit: 2016-07-21 | Discharge: 2016-07-21 | Disposition: A | Discharge status: Home / Self Care | Payer: Medicaid Other | Attending: Emergency Medicine | Admitting: Emergency Medicine

## 2016-07-21 ENCOUNTER — Encounter (HOSPITAL_BASED_OUTPATIENT_CLINIC_OR_DEPARTMENT_OTHER): Payer: Self-pay | Admitting: Emergency Medicine

## 2016-07-21 ENCOUNTER — Emergency Department (HOSPITAL_BASED_OUTPATIENT_CLINIC_OR_DEPARTMENT_OTHER): Payer: Medicaid Other

## 2016-07-21 DIAGNOSIS — Y92810 Car as the place of occurrence of the external cause: Secondary | ICD-10-CM | POA: Diagnosis not present

## 2016-07-21 DIAGNOSIS — S6991XA Unspecified injury of right wrist, hand and finger(s), initial encounter: Secondary | ICD-10-CM | POA: Diagnosis present

## 2016-07-21 DIAGNOSIS — S60221A Contusion of right hand, initial encounter: Secondary | ICD-10-CM | POA: Diagnosis not present

## 2016-07-21 DIAGNOSIS — Y939 Activity, unspecified: Secondary | ICD-10-CM | POA: Diagnosis not present

## 2016-07-21 DIAGNOSIS — Z79899 Other long term (current) drug therapy: Secondary | ICD-10-CM | POA: Insufficient documentation

## 2016-07-21 DIAGNOSIS — W230XXA Caught, crushed, jammed, or pinched between moving objects, initial encounter: Secondary | ICD-10-CM | POA: Insufficient documentation

## 2016-07-21 DIAGNOSIS — Y999 Unspecified external cause status: Secondary | ICD-10-CM | POA: Diagnosis not present

## 2016-07-21 HISTORY — DX: Gastro-esophageal reflux disease without esophagitis: K21.9

## 2016-07-21 MED ORDER — OXYCODONE-ACETAMINOPHEN 5-325 MG PO TABS
1.0000 | ORAL_TABLET | Freq: Once | ORAL | Status: AC
Start: 2016-07-21 — End: 2016-07-21
  Administered 2016-07-21: 1 via ORAL
  Filled 2016-07-21: qty 1

## 2016-07-21 MED ORDER — TRAMADOL HCL 50 MG PO TABS: 50.0000 mg | ORAL_TABLET | Freq: Four times a day (QID) | ORAL | 0 refills | Status: DC | PRN

## 2016-07-21 NOTE — ED Provider Notes (Signed)
[place] DEPT [facility] Provider Note   CSN: [id number] Arrival date & time: 07/21/16  [id number]  First Provider Contact:  First MD Initiated Contact with Patient 07/21/16 [phone]        History   Chief Complaint Chief Complaint  Patient presents with  . Hand Injury    HPI Cassandra Ellis is a 35 y.o. female.  The history is provided by the patient.   She had a car door closed on her right hand. She is complaining of pain in the region of the right fourth and fifth MCP joints. She rates pain at 10/10. She denies other injury.  Past Medical History:  Diagnosis Date  . GERD (gastroesophageal reflux disease)   . Thyroid disease     Patient Active Problem List   Diagnosis Date Noted  . Generalized headache 03/28/2013  . Nausea and vomiting 03/28/2013  . Hypokalemia 03/28/2013  . Intractable headache 03/28/2013    Past Surgical History:  Procedure Laterality Date  . CESAREAN SECTION    . TUBAL LIGATION      OB History    No data available       Home Medications    Prior to Admission medications   Medication Sig Start Date End Date Taking? Authorizing Provider  cholecalciferol (VITAMIN D) 1000 units tablet Take 5,000 Units by mouth daily.   Yes Historical Provider, MD  omeprazole (PRILOSEC) 20 MG capsule Take 1 capsule (20 mg total) by mouth daily. 12/27/15   Leo Grosser, MD  propylthiouracil (PTU) 50 MG tablet Take 50 mg by mouth 3 (three) times daily.    Historical Provider, MD    Family History No family history on file.  Social History Social History  Substance Use Topics  . Smoking status: Never Smoker  . Smokeless tobacco: Never Used  . Alcohol use No     Allergies   Milk-related compounds and Watermelon flavor   Review of Systems Review of Systems  All other systems reviewed and are negative.    Physical Exam Updated Vital Signs BP 153/89 (BP Location: Left Arm)   Pulse 106   Temp 98 F (36.7 C) (Oral)   Resp 20   Ht 5\' 1"  (1.549 m)   Wt  168 lb (76.2 kg)   LMP 07/21/2016 (Exact Date)   SpO2 100%   BMI 31.74 kg/m   Physical Exam  Nursing note and vitals reviewed.  35 year old female, resting comfortably and in no acute distress. Vital signs are significant for hypertension and mild tachycardia. Oxygen saturation is 100%, which is normal. Head is normocephalic and atraumatic. PERRLA, EOMI. Oropharynx is clear. Neck is nontender and supple without adenopathy or JVD. Back is nontender and there is no CVA tenderness. Lungs are clear without rales, wheezes, or rhonchi. Chest is nontender. Heart has regular rate and rhythm without murmur. Abdomen is soft, flat, nontender without masses or hepatosplenomegaly and peristalsis is normoactive. Extremities have no cyanosis or edema, full range of motion is present. There is no significant swelling noted of the right hand. There is moderate tenderness over the region of the right fourth and fifth MCP joints. Neurovascular exam is intact. No lacerations are present. Skin is warm and dry without rash. Neurologic: Mental status is normal, cranial nerves are intact, there are no motor or sensory deficits.  ED Treatments / Results   Radiology Dg Hand Complete Right  Result Date: 07/21/2016 CLINICAL DATA:  Right hand closed in car door, with pain about the third and fourth  metacarpophalangeal joints. Initial encounter. EXAM: RIGHT HAND - COMPLETE 3+ VIEW COMPARISON:  None. FINDINGS: There is no evidence of fracture or dislocation. The joint spaces are preserved. The carpal rows are intact, and demonstrate normal alignment. The soft tissues are unremarkable in appearance. IMPRESSION: No evidence of fracture or dislocation. Electronically Signed   By: Garald Balding M.D.   On: 07/21/2016 02:45    Procedures Procedures (including critical care time)  Medications Ordered in ED Medications  oxyCODONE-acetaminophen (PERCOCET/ROXICET) 5-325 MG per tablet 1 tablet (not administered)      Initial Impression / Assessment and Plan / ED Course  I have reviewed the triage vital signs and the nursing notes.  Pertinent labs & imaging results that were available during my care of the patient were reviewed by me and considered in my medical decision making (see chart for details).  Clinical Course    Contusion of the right hand. X-ray shows no evidence of fracture. She is given a prescription for tramadol for pain but advised to use acetaminophen and ibuprofen preferentially.  Final Clinical Impressions(s) / ED Diagnoses   Final diagnoses:  Contusion of hand, right, initial encounter    New Prescriptions New Prescriptions   TRAMADOL (ULTRAM) 50 MG TABLET    Take 1 tablet (50 mg total) by mouth every 6 (six) hours as needed.     Delora Fuel, MD [id number]

## 2016-07-21 NOTE — ED Triage Notes (Signed)
Car door shut on right hand.  Pain and swelling to 4th finger and knuckle.

## 2016-07-21 NOTE — Discharge Instructions (Signed)
Take acetaminophen, ibuprofen, or naproxen as needed for less severe pain.

## 2016-07-23 ENCOUNTER — Emergency Department (HOSPITAL_BASED_OUTPATIENT_CLINIC_OR_DEPARTMENT_OTHER)
Admission: EM | Admit: 2016-07-23 | Discharge: 2016-07-24 | Disposition: A | Discharge status: Home / Self Care | Payer: Medicaid Other | Attending: Orthopedic Surgery | Admitting: Orthopedic Surgery

## 2016-07-23 ENCOUNTER — Encounter (HOSPITAL_BASED_OUTPATIENT_CLINIC_OR_DEPARTMENT_OTHER): Payer: Self-pay | Admitting: *Deleted

## 2016-07-23 DIAGNOSIS — L02519 Cutaneous abscess of unspecified hand: Secondary | ICD-10-CM

## 2016-07-23 DIAGNOSIS — L02413 Cutaneous abscess of right upper limb: Secondary | ICD-10-CM | POA: Diagnosis not present

## 2016-07-23 DIAGNOSIS — W230XXD Caught, crushed, jammed, or pinched between moving objects, subsequent encounter: Secondary | ICD-10-CM | POA: Insufficient documentation

## 2016-07-23 DIAGNOSIS — L03113 Cellulitis of right upper limb: Secondary | ICD-10-CM | POA: Insufficient documentation

## 2016-07-23 DIAGNOSIS — L0291 Cutaneous abscess, unspecified: Secondary | ICD-10-CM

## 2016-07-23 DIAGNOSIS — L03119 Cellulitis of unspecified part of limb: Secondary | ICD-10-CM

## 2016-07-23 DIAGNOSIS — L0390 Cellulitis, unspecified: Secondary | ICD-10-CM

## 2016-07-23 DIAGNOSIS — S60511D Abrasion of right hand, subsequent encounter: Secondary | ICD-10-CM | POA: Diagnosis not present

## 2016-07-23 LAB — CBC WITH DIFFERENTIAL/PLATELET
Basophils Absolute: 0 K/uL (ref 0.0–0.1)
Basophils Relative: 0 %
Eosinophils Absolute: 0.1 K/uL (ref 0.0–0.7)
Eosinophils Relative: 1 %
HCT: 34 % — ABNORMAL LOW (ref 36.0–46.0)
Hemoglobin: 11.6 g/dL — ABNORMAL LOW (ref 12.0–15.0)
Lymphocytes Relative: 28 %
Lymphs Abs: 2.4 K/uL (ref 0.7–4.0)
MCH: 24.5 pg — ABNORMAL LOW (ref 26.0–34.0)
MCHC: 34.1 g/dL (ref 30.0–36.0)
MCV: 71.7 fL — ABNORMAL LOW (ref 78.0–100.0)
Monocytes Absolute: 1.4 K/uL — ABNORMAL HIGH (ref 0.1–1.0)
Monocytes Relative: 16 %
Neutro Abs: 4.8 K/uL (ref 1.7–7.7)
Neutrophils Relative %: 55 %
Platelets: 247 K/uL (ref 150–400)
RBC: 4.74 MIL/uL (ref 3.87–5.11)
RDW: 14.2 % (ref 11.5–15.5)
WBC: 8.7 K/uL (ref 4.0–10.5)

## 2016-07-23 LAB — BASIC METABOLIC PANEL
Anion gap: 8 (ref 5–15)
BUN: 12 mg/dL (ref 6–20)
CHLORIDE: 103 mmol/L (ref 101–111)
CO2: 24 mmol/L (ref 22–32)
Calcium: 9 mg/dL (ref 8.9–10.3)
Creatinine, Ser: <0.3 mg/dL — ABNORMAL LOW (ref 0.44–1.00)
Glucose, Bld: 108 mg/dL — ABNORMAL HIGH (ref 65–99)
POTASSIUM: 3.5 mmol/L (ref 3.5–5.1)
SODIUM: 135 mmol/L (ref 135–145)

## 2016-07-23 MED ORDER — MORPHINE SULFATE (PF) 4 MG/ML IV SOLN
4.0000 mg | Freq: Once | INTRAVENOUS | Status: AC
  Administered 2016-07-23: 4 mg via INTRAVENOUS
  Filled 2016-07-23: qty 1

## 2016-07-23 MED ORDER — SODIUM CHLORIDE 0.9 % IV BOLUS (SEPSIS)
1000.0000 mL | Freq: Once | INTRAVENOUS | Status: AC
  Administered 2016-07-23: 1000 mL via INTRAVENOUS

## 2016-07-23 MED ORDER — TETANUS-DIPHTH-ACELL PERTUSSIS 5-2.5-18.5 LF-MCG/0.5 IM SUSP
0.5000 mL | Freq: Once | INTRAMUSCULAR | Status: AC
  Administered 2016-07-23: 0.5 mL via INTRAMUSCULAR
  Filled 2016-07-23: qty 0.5

## 2016-07-23 MED ORDER — ONDANSETRON HCL 4 MG/2ML IJ SOLN
4.0000 mg | Freq: Once | INTRAMUSCULAR | Status: AC
  Administered 2016-07-23: 4 mg via INTRAVENOUS
  Filled 2016-07-23: qty 2

## 2016-07-23 MED ORDER — CEFAZOLIN IN D5W 1 GM/50ML IV SOLN
1.0000 g | Freq: Once | INTRAVENOUS | Status: AC
  Administered 2016-07-23: 1 g via INTRAVENOUS
  Filled 2016-07-23: qty 50

## 2016-07-23 MED ORDER — ACETAMINOPHEN 500 MG PO TABS
1000.0000 mg | ORAL_TABLET | Freq: Once | ORAL | Status: AC
  Administered 2016-07-23: 1000 mg via ORAL
  Filled 2016-07-23: qty 2

## 2016-07-23 NOTE — ED Provider Notes (Signed)
[facility] Provider Note   CSN: [id number] Arrival date & time: 07/23/16  2101  First Provider Contact:  First MD Initiated Contact with Patient 07/23/16 2126    By signing my name below, I, Dyke Brackett, attest that this documentation has been prepared under the direction and in the presence of non-physician practitioner, Jeannett Senior, PA-C Electronically Signed: Dyke Brackett, Scribe. 07/23/2016. 9:34 PM.   History   Chief Complaint Chief Complaint  Patient presents with  . Hand Pain    HPI Cassandra Ellis is a right hand dominant 35 y.o. female who presents to the Emergency Department complaining of sudden onset, gradually worsening, moderate right hand pain onset 2 days ago s/p hand injury. She notes erythema, drainage, warmth, and swelling. Pt shut her hand in a car door 2 days ago which caused an abrasion. She was seen in the ED for the same where her x-ray was normal. Today, pt took off her double gloves to ice her hand and noticed drainage. Pt states she cleaned the wound but the drainage worsened. She has used ice and antiseptic wipeswith no relief. Pt also notes nausea, chills, and a lack of appetite.She states she has not eaten in two days.  Per pt, she is a "borderline diabetic".   The history is provided by the patient. No language interpreter was used.    Past Medical History:  Diagnosis Date  . GERD (gastroesophageal reflux disease)   . Thyroid disease     Patient Active Problem List   Diagnosis Date Noted  . Generalized headache 03/28/2013  . Nausea and vomiting 03/28/2013  . Hypokalemia 03/28/2013  . Intractable headache 03/28/2013    Past Surgical History:  Procedure Laterality Date  . CESAREAN SECTION    . TUBAL LIGATION      Home Medications    Prior to Admission medications   Medication Sig Start Date End Date Taking? Authorizing Provider  cholecalciferol (VITAMIN D) 1000 units tablet Take 5,000 Units by mouth daily.    Historical  Provider, MD  omeprazole (PRILOSEC) 20 MG capsule Take 1 capsule (20 mg total) by mouth daily. 12/27/15   Leo Grosser, MD  propylthiouracil (PTU) 50 MG tablet Take 50 mg by mouth 3 (three) times daily.    Historical Provider, MD  traMADol (ULTRAM) 50 MG tablet Take 1 tablet (50 mg total) by mouth every 6 (six) hours as needed. 0000000   Delora Fuel, MD    Family History No family history on file.  Social History Social History  Substance Use Topics  . Smoking status: Never Smoker  . Smokeless tobacco: Never Used  . Alcohol use No     Allergies   Milk-related compounds and Watermelon flavor   Review of Systems Review of Systems  Constitutional: Positive for appetite change and chills.  Gastrointestinal: Positive for nausea.  Skin: Positive for color change and wound.  All other systems reviewed and are negative.    Physical Exam Updated Vital Signs BP 143/76   Pulse 115   Temp 98.3 F (36.8 C) (Oral)   Resp 20   Ht 5\' 1"  (1.549 m)   Wt 168 lb (76.2 kg)   LMP 07/21/2016 (Exact Date)   SpO2 100%   BMI 31.74 kg/m   Physical Exam  Constitutional: She is oriented to person, place, and time. She appears well-developed and well-nourished. No distress.  HENT:  Head: Normocephalic and atraumatic.  Eyes: Conjunctivae are normal.  Cardiovascular: Normal rate.   Pulmonary/Chest: Effort normal.  Abdominal: She exhibits no distension.  Musculoskeletal:  Extensive swelling to the right dorsal hand with small, less than 1 cm wound over the fourth dorsal MCP joint. There is purulent drainage coming from the joint. Unable to flex or extend the fourth digit at MCP joint. X cellulitis extends through the fourth and proximal third finger as well as entire dorsal hand up to the wrist level.  Neurological: She is alert and oriented to person, place, and time.  Skin: Skin is warm and dry.  Psychiatric: She has a normal mood and affect.  Nursing note and vitals reviewed.    ED  Treatments / Results  DIAGNOSTIC STUDIES:  Oxygen Saturation is 100% on RA, normal by my interpretation.    COORDINATION OF CARE:  9:33 PM Discussed treatment plan which includes Tdap, morphine, Zofran, and ancef with pt at bedside and pt agreed to plan.  Labs (all labs ordered are listed, but only abnormal results are displayed) Labs Reviewed - No data to display  EKG  EKG Interpretation None       Radiology No results found.  Procedures Procedures (including critical care time)  Medications Ordered in ED Medications - No data to display   Initial Impression / Assessment and Plan / ED Course  I have reviewed the triage vital signs and the nursing notes.  Pertinent labs & imaging results that were available during my care of the patient were reviewed by me and considered in my medical decision making (see chart for details).  Clinical Course  Comment By Time  Patient with an abscess, purulent drainage from the right hand, cellulitis of the hand, post trauma 3 days ago. Discussed with Dr. Audie Pinto, who spoke with Dr. Grandville Silos, hand surgeon, will transfer patient to [facility] emergency department for Dr. Grandville Silos to see. I spoke with Dr. Dolly Rias who accepts patient. Jeannett Senior, PA-C 08/11 2227  Delay in transfer. Pt's HR went up. Temp rechecked, afebrile. BP normal. Doubt sepsis. Already received dose of antibiotics. Will give tylenol and fluid bolus. Pt stated that "I have thyroid problems and I dont take my medications bc it makes me drowsy, and that is why my HR is high."  Jeannett Senior, PA-C 08/11 2327      Final Clinical Impressions(s) / ED Diagnoses   Final diagnoses:  None   I personally performed the services described in this documentation, which was scribed in my presence. The recorded information has been reviewed and is accurate.   New Prescriptions New Prescriptions   No medications on file     Jeannett Senior, PA-C 07/26/16 1604      Leonard Schwartz, MD 08/01/16 [phone]

## 2016-07-23 NOTE — ED Notes (Signed)
Pt injured her hand in a car door on Tuesday, was seen and treated.  Yesterday she noticed her hand was swollen and warm, and she elevated it and used ice.  She states that it gradually got worse as the day progressed, noticed an abscess forming and she started getting chills as well.

## 2016-07-23 NOTE — ED Triage Notes (Signed)
Infection to her right hand. Red, hot, swollen, painful and drainage. She is having chills and nausea. She shut her hand in a car door 2 days ago and had an abrasion on her skin. She has been wearing double gloves at work.

## 2016-07-24 MED ORDER — LIDOCAINE HCL (PF) 1 % IJ SOLN
INTRAMUSCULAR | Status: AC
  Filled 2016-07-24: qty 10

## 2016-07-24 MED ORDER — LIDOCAINE HCL (PF) 1 % IJ SOLN
10.0000 mL | Freq: Once | INTRAMUSCULAR | Status: AC
  Administered 2016-07-24: 10 mL

## 2016-07-24 MED ORDER — SULFAMETHOXAZOLE-TRIMETHOPRIM 800-160 MG PO TABS: 2.0000 | ORAL_TABLET | Freq: Two times a day (BID) | ORAL | 0 refills | Status: AC

## 2016-07-24 MED ORDER — HYDROCODONE-ACETAMINOPHEN 5-325 MG PO TABS: 1.0000 | ORAL_TABLET | Freq: Four times a day (QID) | ORAL | 0 refills | Status: DC | PRN

## 2016-07-24 MED ORDER — VANCOMYCIN HCL IN DEXTROSE 1-5 GM/200ML-% IV SOLN
1000.0000 mg | Freq: Once | INTRAVENOUS | Status: AC
  Administered 2016-07-24: 1000 mg via INTRAVENOUS
  Filled 2016-07-24: qty 200

## 2016-07-24 NOTE — Consult Note (Signed)
ORTHOPAEDIC CONSULTATION HISTORY & PHYSICAL REQUESTING PHYSICIAN: Dr. Audie Pinto  Chief Complaint: right hand draining purulence  HPI: Cassandra Ellis is a 35 y.o. female who reportedly got her right hand closed in a car door this past Tuesday. She was evaluated in the emergency department on Wednesday because of some continued pain and swelling, and her concerns that perhaps she had sustained a fracture. There was a small break in the skin dorsally in the region overlying the ring MCP joint.  She reports that beginning on Thursday, she began to have some warmth and ultimately some purulent drainage from the wound, prompting her to be evaluated in the emergency department earlier Friday evening.  He was recognized that she had a draining wound, and as a consequence, hand surgical consultation was sought. She was transferred to [facility] ER for evaluation.  Past Medical History:  Diagnosis Date  . GERD (gastroesophageal reflux disease)   . Thyroid disease    Past Surgical History:  Procedure Laterality Date  . CESAREAN SECTION    . TUBAL LIGATION     Social History   Social History  . Marital status: Single    Spouse name: N/A  . Number of children: N/A  . Years of education: N/A   Social History Main Topics  . Smoking status: Never Smoker  . Smokeless tobacco: Never Used  . Alcohol use No  . Drug use:     Types: Marijuana  . Sexual activity: Not Asked   Other Topics Concern  . None   Social History Narrative  . None   No family history on file. Allergies  Allergen Reactions  . Milk-Related Compounds Other (See Comments)    Abd cramping and gas  . Watermelon Flavor Hives   Prior to Admission medications   Medication Sig Start Date End Date Taking? Authorizing Provider  cholecalciferol (VITAMIN D) 1000 units tablet Take 5,000 Units by mouth daily.   Yes Historical Provider, MD  omeprazole (PRILOSEC) 20 MG capsule Take 1 capsule (20 mg total) by mouth daily. 12/27/15  Yes  Leo Grosser, MD  propylthiouracil (PTU) 50 MG tablet Take 50 mg by mouth 3 (three) times daily.   Yes Historical Provider, MD  traMADol (ULTRAM) 50 MG tablet Take 1 tablet (50 mg total) by mouth every 6 (six) hours as needed. 0000000  Yes Delora Fuel, MD  HYDROcodone-acetaminophen (NORCO) 5-325 MG tablet Take 1-2 tablets by mouth every 6 (six) hours as needed. 07/24/16   Milly Jakob, MD  sulfamethoxazole-trimethoprim (BACTRIM DS,SEPTRA DS) 800-160 MG tablet Take 2 tablets by mouth 2 (two) times daily. 07/24/16 07/31/16  Milly Jakob, MD   No results found.  Positive ROS: All other systems have been reviewed and were otherwise negative with the exception of those mentioned in the HPI and as above.  Physical Exam: Vitals: Refer to EMR. Constitutional:  WD, WN, NAD HEENT:  NCAT, EOMI Neuro/Psych:  Alert & oriented to person, place, and time; appropriate mood & affect Lymphatic: No generalized extremity edema or lymphadenopathy Extremities / MSK:  The extremities are normal with respect to appearance, ranges of motion, joint stability, muscle strength/tone, sensation, & perfusion except as otherwise noted:  The patient's right hand has minimal edema. There is an open wound with thick purulent drainage dorsally over the region of the ring finger MCP joint. No pain with torsional stress applied to the MCP joint. Passive range of motion of the MCP joint is mildly painful compared to direct palpation on the dorsal soft tissues.  Assessment: Right hand small dorsal subcutaneous abscess  Plan: After obtaining verbal consent, a field block was performed by me with plain lidocaine. The open wound was extended longitudinally with a 15 blade, creating a 1-1-1/2 cm incision. The subcutaneous space was opened with spreading dissection and the wound was copiously irrigated. The pus pocket was fairly small, not more than 1 cm or so and dorsal to the extensor tendons. Cultures appear to be have been obtained  earlier in the emergency Department at [facility]. Consequently, once finished with irrigation, an iodoform packing was placed and a light dressing was applied. Wound care instructions were given. She has already received Ancef, so we will elect to provide a dose of vancomycin in case this represents MRSA, and she will be discharged on Bactrim. In addition she'll be provided some additional Norco for an analgesic. We will plan to have her back later this week in the office to check the wound, progress of infection, and functional implication such as range of motion. She was instructed to call or return to the emergency department if her condition worsens over the next couple of days.  Rayvon Char Grandville Silos, [address] [place], [employer] [place] [employer]  [id number] Office: [phone] Mobile: [phone]  07/24/2016, 1:21 AM

## 2016-07-24 NOTE — Discharge Instructions (Addendum)
Remove the dressing and pulled the packing Monday morning and begin warm water soaks following that. You may wash the hand and the wound with soap and water in the shower, placing a dab of Polysporin in the wound and reapplying the dressing until there is no drainage. Really work to keep your digits from getting stiff.

## 2016-07-27 LAB — AEROBIC CULTURE W GRAM STAIN (SUPERFICIAL SPECIMEN)
Culture: NORMAL
Gram Stain: NONE SEEN

## 2016-07-27 LAB — AEROBIC CULTURE  (SUPERFICIAL SPECIMEN)

## 2016-11-22 ENCOUNTER — Emergency Department (HOSPITAL_BASED_OUTPATIENT_CLINIC_OR_DEPARTMENT_OTHER)
Admission: EM | Admit: 2016-11-22 | Discharge: 2016-11-22 | Disposition: A | Discharge status: Home / Self Care | Payer: Medicaid Other | Attending: Emergency Medicine | Admitting: Emergency Medicine

## 2016-11-22 ENCOUNTER — Encounter (HOSPITAL_BASED_OUTPATIENT_CLINIC_OR_DEPARTMENT_OTHER): Payer: Self-pay | Admitting: *Deleted

## 2016-11-22 DIAGNOSIS — R1013 Epigastric pain: Secondary | ICD-10-CM | POA: Insufficient documentation

## 2016-11-22 DIAGNOSIS — R112 Nausea with vomiting, unspecified: Secondary | ICD-10-CM

## 2016-11-22 DIAGNOSIS — R197 Diarrhea, unspecified: Secondary | ICD-10-CM | POA: Insufficient documentation

## 2016-11-22 LAB — URINALYSIS, ROUTINE W REFLEX MICROSCOPIC
BILIRUBIN URINE: NEGATIVE
Glucose, UA: NEGATIVE mg/dL
Hgb urine dipstick: NEGATIVE
Ketones, ur: NEGATIVE mg/dL
LEUKOCYTES UA: NEGATIVE
NITRITE: NEGATIVE
Protein, ur: NEGATIVE mg/dL
SPECIFIC GRAVITY, URINE: 1.024 (ref 1.005–1.030)
pH: 6 (ref 5.0–8.0)

## 2016-11-22 LAB — PREGNANCY, URINE: Preg Test, Ur: NEGATIVE

## 2016-11-22 MED ORDER — ONDANSETRON 8 MG PO TBDP: 8.0000 mg | ORAL_TABLET | Freq: Three times a day (TID) | ORAL | 0 refills | Status: DC | PRN

## 2016-11-22 MED ORDER — SODIUM CHLORIDE 0.9 % IV BOLUS (SEPSIS)
1000.0000 mL | Freq: Once | INTRAVENOUS | Status: AC
  Administered 2016-11-22: 1000 mL via INTRAVENOUS

## 2016-11-22 MED ORDER — ONDANSETRON 8 MG PO TBDP
8.0000 mg | ORAL_TABLET | Freq: Once | ORAL | Status: AC
  Administered 2016-11-22: 8 mg via ORAL

## 2016-11-22 MED ORDER — ONDANSETRON HCL 4 MG/2ML IJ SOLN
4.0000 mg | Freq: Once | INTRAMUSCULAR | Status: AC
  Administered 2016-11-22: 4 mg via INTRAVENOUS
  Filled 2016-11-22: qty 2

## 2016-11-22 MED ORDER — ONDANSETRON 8 MG PO TBDP
ORAL_TABLET | ORAL | Status: AC
  Filled 2016-11-22: qty 1

## 2016-11-22 NOTE — ED Notes (Signed)
Pt tolerated po fluids but states she is still nauseated. No vomiting noted. MD at bedside.

## 2016-11-22 NOTE — ED Triage Notes (Addendum)
Pt states that she vomited 6 times since last night. C/o upper mid abd pain that she describes as a nagging type pain. States pain is constant. C/o diarrhea as well. Denies any other symptoms. Pt denies any other sick family members.

## 2016-11-22 NOTE — ED Notes (Signed)
MD with pt  

## 2016-11-22 NOTE — ED Provider Notes (Signed)
[facility] Provider Note   CSN: [id number] Arrival date & time: 11/22/16  0413     History   Chief Complaint Chief Complaint  Patient presents with  . Abdominal Pain    HPI Cassandra Ellis is a 35 y.o. female.  The history is provided by the patient.  Abdominal Pain   This is a new problem. The current episode started 3 to 5 hours ago. The problem occurs constantly. The problem has been gradually worsening. The pain is located in the epigastric region. The pain is moderate. Associated symptoms include diarrhea, nausea and vomiting. Pertinent negatives include hematochezia and melena. Nothing aggravates the symptoms. Nothing relieves the symptoms.  Patient presents with onset of nausea/vomiting/diarrhea several hrs ago She reports multiple episodes of both She reports epigastric ABD pain She reports chills No cough No low abdominal pain No recent travel No sick contacts   Past Medical History:  Diagnosis Date  . GERD (gastroesophageal reflux disease)   . Thyroid disease     Patient Active Problem List   Diagnosis Date Noted  . Generalized headache 03/28/2013  . Nausea and vomiting 03/28/2013  . Hypokalemia 03/28/2013  . Intractable headache 03/28/2013    Past Surgical History:  Procedure Laterality Date  . CESAREAN SECTION    . TUBAL LIGATION      OB History    No data available       Home Medications    Prior to Admission medications   Medication Sig Start Date End Date Taking? Authorizing Provider  cholecalciferol (VITAMIN D) 1000 units tablet Take 5,000 Units by mouth daily.    Historical Provider, MD  HYDROcodone-acetaminophen (NORCO) 5-325 MG tablet Take 1-2 tablets by mouth every 6 (six) hours as needed. 07/24/16   Milly Jakob, MD  omeprazole (PRILOSEC) 20 MG capsule Take 1 capsule (20 mg total) by mouth daily. 12/27/15   Leo Grosser, MD  propylthiouracil (PTU) 50 MG tablet Take 50 mg by mouth 3 (three) times daily.    Historical  Provider, MD  traMADol (ULTRAM) 50 MG tablet Take 1 tablet (50 mg total) by mouth every 6 (six) hours as needed. 0000000   Delora Fuel, MD    Family History No family history on file.  Social History Social History  Substance Use Topics  . Smoking status: Never Smoker  . Smokeless tobacco: Never Used  . Alcohol use No     Allergies   Milk-related compounds and Watermelon flavor   Review of Systems Review of Systems  Constitutional: Positive for chills.  Respiratory: Negative for cough.   Gastrointestinal: Positive for abdominal pain, diarrhea, nausea and vomiting. Negative for hematochezia and melena.  All other systems reviewed and are negative.    Physical Exam Updated Vital Signs BP 141/84   Pulse 90   Temp 98.1 F (36.7 C) (Oral)   Resp 18   Ht 5\' 1"  (1.549 m)   Wt 78.5 kg   LMP 11/02/2016 (Approximate)   SpO2 100%   BMI 32.69 kg/m   Physical Exam  CONSTITUTIONAL: Well developed/well nourished HEAD: Normocephalic/atraumatic EYES: EOMI/PERRL, no icterus ENMT: Mucous membranes moist NECK: supple no meningeal signs SPINE/BACK:entire spine nontender CV: S1/S2 noted, no murmurs/rubs/gallops noted LUNGS: Lungs are clear to auscultation bilaterally, no apparent distress ABDOMEN: soft, mild epigastric tenderness, no rebound or guarding, bowel sounds noted throughout abdomen GU:no cva tenderness NEURO: Pt is awake/alert/appropriate, moves all extremitiesx4.  No facial droop.   EXTREMITIES: pulses normal/equal SKIN: warm, color normal PSYCH: no abnormalities of  mood noted, alert and oriented to situation  ED Treatments / Results  Labs (all labs ordered are listed, but only abnormal results are displayed) Labs Reviewed  PREGNANCY, URINE  URINALYSIS, ROUTINE W REFLEX MICROSCOPIC    EKG  EKG Interpretation None       Radiology No results found.  Procedures Procedures (including critical care time)  Medications Ordered in ED Medications    ondansetron (ZOFRAN-ODT) disintegrating tablet 8 mg (8 mg Oral Given 11/22/16 0437)  sodium chloride 0.9 % bolus 1,000 mL (1,000 mLs Intravenous New Bag/Given 11/22/16 0534)  ondansetron (ZOFRAN) injection 4 mg (4 mg Intravenous Given 11/22/16 0533)     Initial Impression / Assessment and Plan / ED Course  I have reviewed the triage vital signs and the nursing notes.  Pertinent labs results that were available during my care of the patient were reviewed by me and considered in my medical decision making (see chart for details).  Clinical Course     6:09 AM Pt with vomitng/diarrhea She is nontoxic She is taking PO No vomiting here She feels improved with IV fluids Suspect this is related to a viral illness Will d/c home Pt agreeable We discussed strict return precautions   Final Clinical Impressions(s) / ED Diagnoses   Final diagnoses:  Nausea vomiting and diarrhea  Epigastric pain    New Prescriptions New Prescriptions   No medications on file     Ripley Fraise, MD 11/22/16 [phone]

## 2016-11-22 NOTE — ED Notes (Signed)
MD at bedside. Po fluids given

## 2016-11-22 NOTE — Discharge Instructions (Signed)

## 2017-02-14 ENCOUNTER — Encounter (HOSPITAL_COMMUNITY): Payer: Self-pay

## 2017-02-14 ENCOUNTER — Inpatient Hospital Stay (HOSPITAL_COMMUNITY)
Admission: AD | Admit: 2017-02-14 | Discharge: 2017-02-14 | Disposition: A | Discharge status: Home / Self Care | Payer: Medicaid Other | Source: Ambulatory Visit | Attending: Obstetrics and Gynecology | Admitting: Obstetrics and Gynecology

## 2017-02-14 DIAGNOSIS — R102 Pelvic and perineal pain: Secondary | ICD-10-CM | POA: Insufficient documentation

## 2017-02-14 DIAGNOSIS — R51 Headache: Secondary | ICD-10-CM | POA: Insufficient documentation

## 2017-02-14 DIAGNOSIS — N939 Abnormal uterine and vaginal bleeding, unspecified: Secondary | ICD-10-CM

## 2017-02-14 DIAGNOSIS — Z9851 Tubal ligation status: Secondary | ICD-10-CM | POA: Insufficient documentation

## 2017-02-14 DIAGNOSIS — D259 Leiomyoma of uterus, unspecified: Secondary | ICD-10-CM | POA: Insufficient documentation

## 2017-02-14 DIAGNOSIS — Z79899 Other long term (current) drug therapy: Secondary | ICD-10-CM | POA: Insufficient documentation

## 2017-02-14 LAB — WET PREP, GENITAL
Sperm: NONE SEEN
TRICH WET PREP: NONE SEEN
YEAST WET PREP: NONE SEEN

## 2017-02-14 LAB — URINALYSIS, ROUTINE W REFLEX MICROSCOPIC
Bilirubin Urine: NEGATIVE
GLUCOSE, UA: NEGATIVE mg/dL
Ketones, ur: NEGATIVE mg/dL
LEUKOCYTES UA: NEGATIVE
Nitrite: NEGATIVE
Protein, ur: NEGATIVE mg/dL
SPECIFIC GRAVITY, URINE: 1.015 (ref 1.005–1.030)
pH: 6.5 (ref 5.0–8.0)

## 2017-02-14 LAB — URINALYSIS, MICROSCOPIC (REFLEX)

## 2017-02-14 LAB — CBC
HEMATOCRIT: 34.2 % — AB (ref 36.0–46.0)
HEMOGLOBIN: 11.3 g/dL — AB (ref 12.0–15.0)
MCH: 23.7 pg — ABNORMAL LOW (ref 26.0–34.0)
MCHC: 33 g/dL (ref 30.0–36.0)
MCV: 71.8 fL — AB (ref 78.0–100.0)
Platelets: 235 10*3/uL (ref 150–400)
RBC: 4.76 MIL/uL (ref 3.87–5.11)
RDW: 15 % (ref 11.5–15.5)
WBC: 5.9 10*3/uL (ref 4.0–10.5)

## 2017-02-14 LAB — POCT PREGNANCY, URINE: Preg Test, Ur: NEGATIVE

## 2017-02-14 MED ORDER — MEGESTROL ACETATE 20 MG PO TABS: 40.0000 mg | ORAL_TABLET | Freq: Two times a day (BID) | ORAL | 3 refills | Status: DC

## 2017-02-14 MED ORDER — MEGESTROL ACETATE 20 MG PO TABS: 80.0000 mg | ORAL_TABLET | Freq: Two times a day (BID) | ORAL | 0 refills | Status: DC

## 2017-02-14 NOTE — MAU Provider Note (Signed)
Chief Complaint: Vaginal Bleeding   First Provider Initiated Contact with Patient 02/14/17 0208      SUBJECTIVE HPI: Cassandra Ellis is a 36 y.o. nonpregnant female who presents to maternity admissions reporting pain with intercourse 2 weeks ago then onset of vaginal bleeding that has continued x 2 weeks.  She reports the bleeding has varied in amount, heavy some days and light others.  It is associated with cramping pelvic pain that is intermittent and unchanged since onset and intermittent headaches which are worsening in last 2 weeks.  She has not tried any treatments for bleeding, nothing makes it better or worse.   She denies vaginal itching/burning, urinary symptoms, h/a, dizziness, n/v, or fever/chills.     HPI  Past Medical History:  Diagnosis Date  . GERD (gastroesophageal reflux disease)   . Thyroid disease    Past Surgical History:  Procedure Laterality Date  . CESAREAN SECTION    . TUBAL LIGATION     Social History   Social History  . Marital status: Single    Spouse name: N/A  . Number of children: N/A  . Years of education: N/A   Occupational History  . Not on file.   Social History Main Topics  . Smoking status: Never Smoker  . Smokeless tobacco: Never Used  . Alcohol use No  . Drug use: Yes    Types: Marijuana  . Sexual activity: Yes    Birth control/ protection: Surgical   Other Topics Concern  . Not on file   Social History Narrative  . No narrative on file   No current facility-administered medications on file prior to encounter.    Current Outpatient Prescriptions on File Prior to Encounter  Medication Sig Dispense Refill  . cholecalciferol (VITAMIN D) 1000 units tablet Take 5,000 Units by mouth daily.    Marland Kitchen omeprazole (PRILOSEC) 20 MG capsule Take 1 capsule (20 mg total) by mouth daily. 30 capsule 0  . ondansetron (ZOFRAN ODT) 8 MG disintegrating tablet Take 1 tablet (8 mg total) by mouth every 8 (eight) hours as needed for nausea or vomiting. 8  tablet 0  . propylthiouracil (PTU) 50 MG tablet Take 50 mg by mouth 3 (three) times daily.     Allergies  Allergen Reactions  . Milk-Related Compounds Other (See Comments)    Abd cramping and gas  . Watermelon Flavor Hives    ROS:  Review of Systems  Constitutional: Negative for chills, fatigue and fever.  Respiratory: Negative for shortness of breath.   Cardiovascular: Negative for chest pain.  Gastrointestinal: Negative for nausea and vomiting.  Genitourinary: Positive for pelvic pain and vaginal bleeding. Negative for difficulty urinating, dysuria, flank pain, vaginal discharge and vaginal pain.  Neurological: Positive for headaches. Negative for dizziness.  Psychiatric/Behavioral: Negative.      I have reviewed patient's Past Medical Hx, Surgical Hx, Family Hx, Social Hx, medications and allergies.   Physical Exam   Patient Vitals for the past 24 hrs:  BP Temp Temp src Pulse Resp SpO2 Height Weight  02/14/17 0300 158/77 - - 106 17 - - -  02/14/17 0118 159/70 97.9 F (36.6 C) Oral 108 17 99 % - -  02/14/17 0108 - - - - - - 5\' 1"  (1.549 m) 169 lb (76.7 kg)   Constitutional: Well-developed, well-nourished female in no acute distress.  Cardiovascular: normal rate Respiratory: normal effort GI: Abd soft, non-tender. Pos BS x 4 MS: Extremities nontender, no edema, normal ROM Neurologic: Alert and oriented x  4.  GU: Neg CVAT.  PELVIC EXAM: Cervix pink, visually closed, without lesion, small amount dark red bleeding, vaginal walls and external genitalia normal Bimanual exam: Cervix 0/long/high, firm, anterior, neg CMT, uterus tender, ~ 9 week size, irregular in shape, adnexa without tenderness, enlargement, or mass  LAB RESULTS Results for orders placed or performed during the hospital encounter of 02/14/17 (from the past 24 hour(s))  Urinalysis, Routine w reflex microscopic     Status: Abnormal   Collection Time: 02/14/17  1:30 AM  Result Value Ref Range   Color, Urine  YELLOW YELLOW   APPearance CLEAR CLEAR   Specific Gravity, Urine 1.015 1.005 - 1.030   pH 6.5 5.0 - 8.0   Glucose, UA NEGATIVE NEGATIVE mg/dL   Hgb urine dipstick MODERATE (A) NEGATIVE   Bilirubin Urine NEGATIVE NEGATIVE   Ketones, ur NEGATIVE NEGATIVE mg/dL   Protein, ur NEGATIVE NEGATIVE mg/dL   Nitrite NEGATIVE NEGATIVE   Leukocytes, UA NEGATIVE NEGATIVE  Urinalysis, Microscopic (reflex)     Status: Abnormal   Collection Time: 02/14/17  1:30 AM  Result Value Ref Range   RBC / HPF 0-5 0 - 5 RBC/hpf   WBC, UA 0-5 0 - 5 WBC/hpf   Bacteria, UA FEW (A) NONE SEEN   Squamous Epithelial / LPF 0-5 (A) NONE SEEN  Pregnancy, urine POC     Status: None   Collection Time: 02/14/17  2:04 AM  Result Value Ref Range   Preg Test, Ur NEGATIVE NEGATIVE  Wet prep, genital     Status: Abnormal   Collection Time: 02/14/17  2:23 AM  Result Value Ref Range   Yeast Wet Prep HPF POC NONE SEEN NONE SEEN   Trich, Wet Prep NONE SEEN NONE SEEN   Clue Cells Wet Prep HPF POC PRESENT (A) NONE SEEN   WBC, Wet Prep HPF POC MODERATE (A) NONE SEEN   Sperm NONE SEEN   CBC     Status: Abnormal   Collection Time: 02/14/17  2:40 AM  Result Value Ref Range   WBC 5.9 4.0 - 10.5 K/uL   RBC 4.76 3.87 - 5.11 MIL/uL   Hemoglobin 11.3 (L) 12.0 - 15.0 g/dL   HCT 34.2 (L) 36.0 - 46.0 %   MCV 71.8 (L) 78.0 - 100.0 fL   MCH 23.7 (L) 26.0 - 34.0 pg   MCHC 33.0 30.0 - 36.0 g/dL   RDW 15.0 11.5 - 15.5 %   Platelets 235 150 - 400 K/uL       IMAGING No results found.  MAU Management/MDM: Ordered labs and reviewed results.  Mild anema today, bleeding and exam c/w fibroid uterus.  Outpatient [place] and follow up appointment in [facility] office.  Rx for Megace 80 mg BID, decrease to 40 mg BID if bleeding stops.   Pt encouraged to take women's multivitamin or daily iron supplement.  Pt stable at time of discharge.  ASSESSMENT 1. Abnormal uterine bleeding (AUB)   2. Uterine leiomyoma, unspecified location     PLAN Discharge  home Allergies as of 02/14/2017      Reactions   Milk-related Compounds Other (See Comments)   Abd cramping and gas   Watermelon Flavor Hives      Medication List    TAKE these medications   cholecalciferol 1000 units tablet Commonly known as:  VITAMIN D Take 5,000 Units by mouth daily.   megestrol 20 MG tablet Commonly known as:  MEGACE Take 4 tablets (80 mg total) by mouth 2 (  two) times daily.   megestrol 20 MG tablet Commonly known as:  MEGACE Take 2 tablets (40 mg total) by mouth 2 (two) times daily.   omeprazole 20 MG capsule Commonly known as:  PRILOSEC Take 1 capsule (20 mg total) by mouth daily.   ondansetron 8 MG disintegrating tablet Commonly known as:  ZOFRAN ODT Take 1 tablet (8 mg total) by mouth every 8 (eight) hours as needed for nausea or vomiting.   propylthiouracil 50 MG tablet Commonly known as:  PTU Take 50 mg by mouth 3 (three) times daily.      Follow-up [facility].   Specialty:  Obstetrics and Gynecology Why:  The office will call you with an appointment. Return to MAU as needed for emergencies. Contact information: [facility].   Specialty:  Radiology Why:  Ultrasound will call you with an appointment Contact information: [address] [id number] [place] Certified Nurse-Midwife 02/14/2017  4:26 AM

## 2017-02-14 NOTE — MAU Note (Signed)
Pt c/o vaginal bleeding x2 weeks. States she and partner were having intercourse and "it felt like he hit something." States the bleeding varies and ranges from spotting to heavy like a period. Having lower abdominal and back cramping. Rates 10/10. Has been taking Aleve-last took around 1800. LMP 01/19/2017

## 2017-02-15 LAB — GC/CHLAMYDIA PROBE AMP (~~LOC~~) NOT AT ARMC
Chlamydia: NEGATIVE
Neisseria Gonorrhea: NEGATIVE

## 2017-03-02 ENCOUNTER — Ambulatory Visit (HOSPITAL_COMMUNITY): Payer: Medicaid Other

## 2017-03-07 ENCOUNTER — Ambulatory Visit (HOSPITAL_COMMUNITY)
Admission: RE | Admit: 2017-03-07 | Discharge: 2017-03-07 | Disposition: A | Discharge status: Home / Self Care | Payer: Medicaid Other | Source: Ambulatory Visit | Attending: Advanced Practice Midwife | Admitting: Advanced Practice Midwife

## 2017-03-07 ENCOUNTER — Encounter: Payer: Medicaid Other | Admitting: Obstetrics and Gynecology

## 2017-03-07 DIAGNOSIS — D259 Leiomyoma of uterus, unspecified: Secondary | ICD-10-CM

## 2017-03-07 DIAGNOSIS — N939 Abnormal uterine and vaginal bleeding, unspecified: Secondary | ICD-10-CM | POA: Insufficient documentation

## 2017-03-07 DIAGNOSIS — D252 Subserosal leiomyoma of uterus: Secondary | ICD-10-CM | POA: Insufficient documentation

## 2017-03-08 ENCOUNTER — Encounter: Payer: Self-pay | Admitting: Obstetrics & Gynecology

## 2017-03-08 ENCOUNTER — Ambulatory Visit (INDEPENDENT_AMBULATORY_CARE_PROVIDER_SITE_OTHER): Payer: Medicaid Other | Admitting: Obstetrics & Gynecology

## 2017-03-08 ENCOUNTER — Other Ambulatory Visit (HOSPITAL_COMMUNITY)
Admission: RE | Admit: 2017-03-08 | Discharge: 2017-03-08 | Disposition: A | Discharge status: Home / Self Care | Payer: Medicaid Other | Source: Ambulatory Visit | Attending: Obstetrics and Gynecology | Admitting: Obstetrics and Gynecology

## 2017-03-08 VITALS — BP 143/85 | HR 93 | Wt 168.6 lb

## 2017-03-08 DIAGNOSIS — Z01419 Encounter for gynecological examination (general) (routine) without abnormal findings: Secondary | ICD-10-CM | POA: Insufficient documentation

## 2017-03-08 DIAGNOSIS — Z0000 Encounter for general adult medical examination without abnormal findings: Secondary | ICD-10-CM | POA: Diagnosis not present

## 2017-03-08 DIAGNOSIS — Z309 Encounter for contraceptive management, unspecified: Secondary | ICD-10-CM | POA: Diagnosis present

## 2017-03-08 DIAGNOSIS — D259 Leiomyoma of uterus, unspecified: Secondary | ICD-10-CM

## 2017-03-08 MED ORDER — MEGESTROL ACETATE 40 MG PO TABS: 40.0000 mg | ORAL_TABLET | Freq: Two times a day (BID) | ORAL | 3 refills | Status: DC

## 2017-03-08 NOTE — Progress Notes (Signed)
Subjective:     Cassandra Ellis is a 36 y.o. female here for a routine exam.  G1P1 LMP LNMP 11/2016. Pt began to have heavy menses and irreg menses in 01/2017     s/ p BTL at age 60 year.  Current complaints: AUB due to uterine fibroids.  Pt wants definitive tx for fibroids with a hyst. She does not want to consider further conservative options. .   Gynecologic History Patient's last menstrual period was 03/08/2017. Contraception: tubal ligation Last Pap: 2009 [facility]. Results were: normal Last mammogram: n/a.   Obstetric History OB History  No data available    The following portions of the patient's history were reviewed and updated as appropriate: allergies, current medications, past family history, past medical history, past social history, past surgical history and problem list.  Review of Systems Pertinent items are noted in HPI.    Objective:   BP (!) 143/85   Pulse 93   Wt 168 lb 9.6 oz (76.5 kg)   LMP 03/08/2017   BMI 31.86 kg/m  General Appearance:    Alert, cooperative, no distress, appears stated age  Head:    Normocephalic, without obvious abnormality, atraumatic  Eyes:    conjunctiva/corneas clear, EOM's intact, both eyes  Ears:    Normal external ear canals, both ears  Nose:   Nares normal, septum midline, mucosa normal, no drainage    or sinus tenderness  Throat:   Lips, mucosa, and tongue normal; teeth and gums normal  Neck:   Supple, symmetrical, trachea midline, no adenopathy;    thyroid:  no enlargement/tenderness/nodules  Back:     Symmetric, no curvature, ROM normal, no CVA tenderness  Lungs:     Clear to auscultation bilaterally, respirations unlabored  Chest Wall:    No tenderness or deformity   Heart:    Regular rate and rhythm, S1 and S2 normal, no murmur, rub   or gallop  Breast Exam:    No tenderness, masses, or nipple abnormality  Abdomen:     Soft, non-tender, bowel sounds active all four quadrants,    no masses, no organomegaly  Genitalia:    Normal  female without lesion, discharge or tenderness; uterus enlarged.  Mobile. NT     Extremities:   Extremities normal, atraumatic, no cyanosis or edema  Pulses:   2+ and symmetric all extremities  Skin:   Skin color, texture, turgor normal, no rashes or lesions    03/07/2017 CLINICAL DATA:  Abnormal uterine bleeding.  Fibroids.  EXAM: TRANSABDOMINAL AND TRANSVAGINAL ULTRASOUND OF PELVIS  TECHNIQUE: Both transabdominal and transvaginal ultrasound examinations of the pelvis were performed. Transabdominal technique was performed for global imaging of the pelvis including uterus, ovaries, adnexal regions, and pelvic cul-de-sac. It was necessary to proceed with endovaginal exam following the transabdominal exam to visualize the endometrium.  COMPARISON:  None  FINDINGS: Uterus  Measurements: 9.5 x 5.9 x 8.6 cm diffusely heterogeneous. Multiple sub endometrial echogenic nodules are identified. There is a subserosal fibroid arising from the anterior fundus measuring 1.6 x 1.8 by 1.5 cm.  Endometrium  Thickness: 9.4 mm.  No focal abnormality visualized.  Right ovary  Measurements: 2.0 x 2.2 x 1.8 cm. Normal appearance/no adnexal mass.  Left ovary  Measurements: 2.9 x 1.8 x 2.0 cm. Normal appearance/no adnexal mass.  Other findings  No abnormal free fluid.  IMPRESSION: 1. Heterogeneous myometrium and multiple sub endometrial echogenic nodules are identified. Findings are compatible with adenomyosis. 2. Small subserosal fibroid arises from the anterior  uterine fundus.  Assessment:    Healthy female exam.   AUB Fibroids- I have reviewed conservative tx options to control her bleeding. She will attempt to use Megace.   Plan:  Megace 40mg  bid f/u in 3 months or sooner prn  rec apply for financial assistance  Olan Kurek L. Harraway-Smith, M.D., [place]nn June

## 2017-03-08 NOTE — Patient Instructions (Addendum)
Uterine Fibroids Uterine fibroids are tissue masses (tumors) that can develop in the womb (uterus). They are also called leiomyomas. This type of tumor is not cancerous (benign) and does not spread to other parts of the body outside of the pelvic area, which is between the hip bones. Occasionally, fibroids may develop in the fallopian tubes, in the cervix, or on the support structures (ligaments) that surround the uterus. You can have one or many fibroids. Fibroids can vary in size, weight, and where they grow in the uterus. Some can become quite large. Most fibroids do not require medical treatment. What are the causes? A fibroid can develop when a single uterine cell keeps growing (replicating). Most cells in the human body have a control mechanism that keeps them from replicating without control. What are the signs or symptoms? Symptoms may include:  Heavy bleeding during your period.  Bleeding or spotting between periods.  Pelvic pain and pressure.  Bladder problems, such as needing to urinate more often (urinary frequency) or urgently.  Inability to reproduce offspring (infertility).  Miscarriages. How is this diagnosed? Uterine fibroids are diagnosed through a physical exam. Your health care provider may feel the lumpy tumors during a pelvic exam. Ultrasonography and an MRI may be done to determine the size, location, and number of fibroids. How is this treated? Treatment may include:  Watchful waiting. This involves getting the fibroid checked by your health care provider to see if it grows or shrinks. Follow your health care provider's recommendations for how often to have this checked.  Hormone medicines. These can be taken by mouth or given through an intrauterine device (IUD).  Surgery.  Removing the fibroids (myomectomy) or the uterus (hysterectomy).  Removing blood supply to the fibroids (uterine artery embolization). If fibroids interfere with your fertility and you  want to become pregnant, your health care provider may recommend having the fibroids removed. Follow these instructions at home:  Keep all follow-up visits as directed by your health care provider. This is important.  Take over-the-counter and prescription medicines only as told by your health care provider.  If you were prescribed a hormone treatment, take the hormone medicines exactly as directed.  Ask your health care provider about taking iron pills and increasing the amount of dark green, leafy vegetables in your diet. These actions can help to boost your blood iron levels, which may be affected by heavy menstrual bleeding.  Pay close attention to your period and tell your health care provider about any changes, such as:  Increased blood flow that requires you to use more pads or tampons than usual per month.  A change in the number of days that your period lasts per month.  A change in symptoms that are associated with your period, such as abdominal cramping or back pain. Contact a health care provider if:  You have pelvic pain, back pain, or abdominal cramps that cannot be controlled with medicines.  You have an increase in bleeding between and during periods.  You soak tampons or pads in a half hour or less.  You feel lightheaded, extra tired, or weak. Get help right away if:  You faint.  You have a sudden increase in pelvic pain. This information is not intended to replace advice given to you by your health care provider. Make sure you discuss any questions you have with your health care provider. Document Released: 11/26/2000 Document Revised: 07/29/2016 Document Reviewed: 05/28/2014 Elsevier Interactive Patient Education  2017 [employer] Hysterectomy Information A   hysterectomy is a surgery in which your uterus is removed. This surgery may be done to treat various medical problems. After the surgery, you will no longer have menstrual periods. The surgery will also  make you unable to become pregnant (sterile). The fallopian tubes and ovaries can be removed (bilateral salpingo-oophorectomy) during this surgery as well. Reasons for a hysterectomy  Persistent, abnormal bleeding.  Lasting (chronic) pelvic pain or infection.  The lining of the uterus (endometrium) starts growing outside the uterus (endometriosis).  The endometrium starts growing in the muscle of the uterus (adenomyosis).  The uterus falls down into the vagina (pelvic organ prolapse).  Noncancerous growths in the uterus (uterine fibroids) that cause symptoms.  Precancerous cells.  Cervical cancer or uterine cancer. Types of hysterectomies  Supracervical hysterectomy-In this type, the top part of the uterus is removed, but not the cervix.  Total hysterectomy-The uterus and cervix are removed.  Radical hysterectomy-The uterus, the cervix, and the fibrous tissue that holds the uterus in place in the pelvis (parametrium) are removed. Ways a hysterectomy can be performed  Abdominal hysterectomy-A large surgical cut (incision) is made in the abdomen. The uterus is removed through this incision.  Vaginal hysterectomy-An incision is made in the vagina. The uterus is removed through this incision. There are no abdominal incisions.  Conventional laparoscopic hysterectomy-Three or four small incisions are made in the abdomen. A thin, lighted tube with a camera (laparoscope) is inserted into one of the incisions. Other tools are put through the other incisions. The uterus is cut into small pieces. The small pieces are removed through the incisions, or they are removed through the vagina.  Laparoscopically assisted vaginal hysterectomy (LAVH)-Three or four small incisions are made in the abdomen. Part of the surgery is performed laparoscopically and part vaginally. The uterus is removed through the vagina.  Robot-assisted laparoscopic hysterectomy-A laparoscope and other tools are inserted into  3 or 4 small incisions in the abdomen. A computer-controlled device is used to give the surgeon a 3D image and to help control the surgical instruments. This allows for more precise movements of surgical instruments. The uterus is cut into small pieces and removed through the incisions or removed through the vagina. What are the risks? Possible complications associated with this procedure include:  Bleeding and risk of blood transfusion. Tell your health care provider if you do not want to receive any blood products.  Blood clots in the legs or lung.  Infection.  Injury to surrounding organs.  Problems or side effects related to anesthesia.  Conversion to an abdominal hysterectomy from one of the other techniques. What to expect after a hysterectomy  You will be given pain medicine.  You will need to have someone with you for the first 3-5 days after you go home.  You will need to follow up with your surgeon in 2-4 weeks after surgery to evaluate your progress.  You may have early menopause symptoms such as hot flashes, night sweats, and insomnia.  If you had a hysterectomy for a problem that was not cancer or not a condition that could lead to cancer, then you no longer need Pap tests. However, even if you no longer need a Pap test, a regular exam is a good idea to make sure no other problems are starting. This information is not intended to replace advice given to you by your health care provider. Make sure you discuss any questions you have with your health care provider. Document Released: 05/25/2001 Document Revised:   05/06/2016 Document Reviewed: 08/06/2013 Elsevier Interactive Patient Education  2017 Elsevier Inc.  

## 2017-03-10 LAB — CYTOLOGY - PAP
DIAGNOSIS: NEGATIVE
HPV (WINDOPATH): NOT DETECTED

## 2017-04-13 IMAGING — CR DG CHEST 2V
2 series · 2 of 2 positions shown · non-contrast
Comparison: None.

CLINICAL DATA: Chest pain and shortness of breath. Acute onset of
pain while patient from sleep. Pain is constant. History of enlarged
left ventricle from echo down on [REDACTED].

EXAM:
CHEST  2 VIEW

[w chest pa]
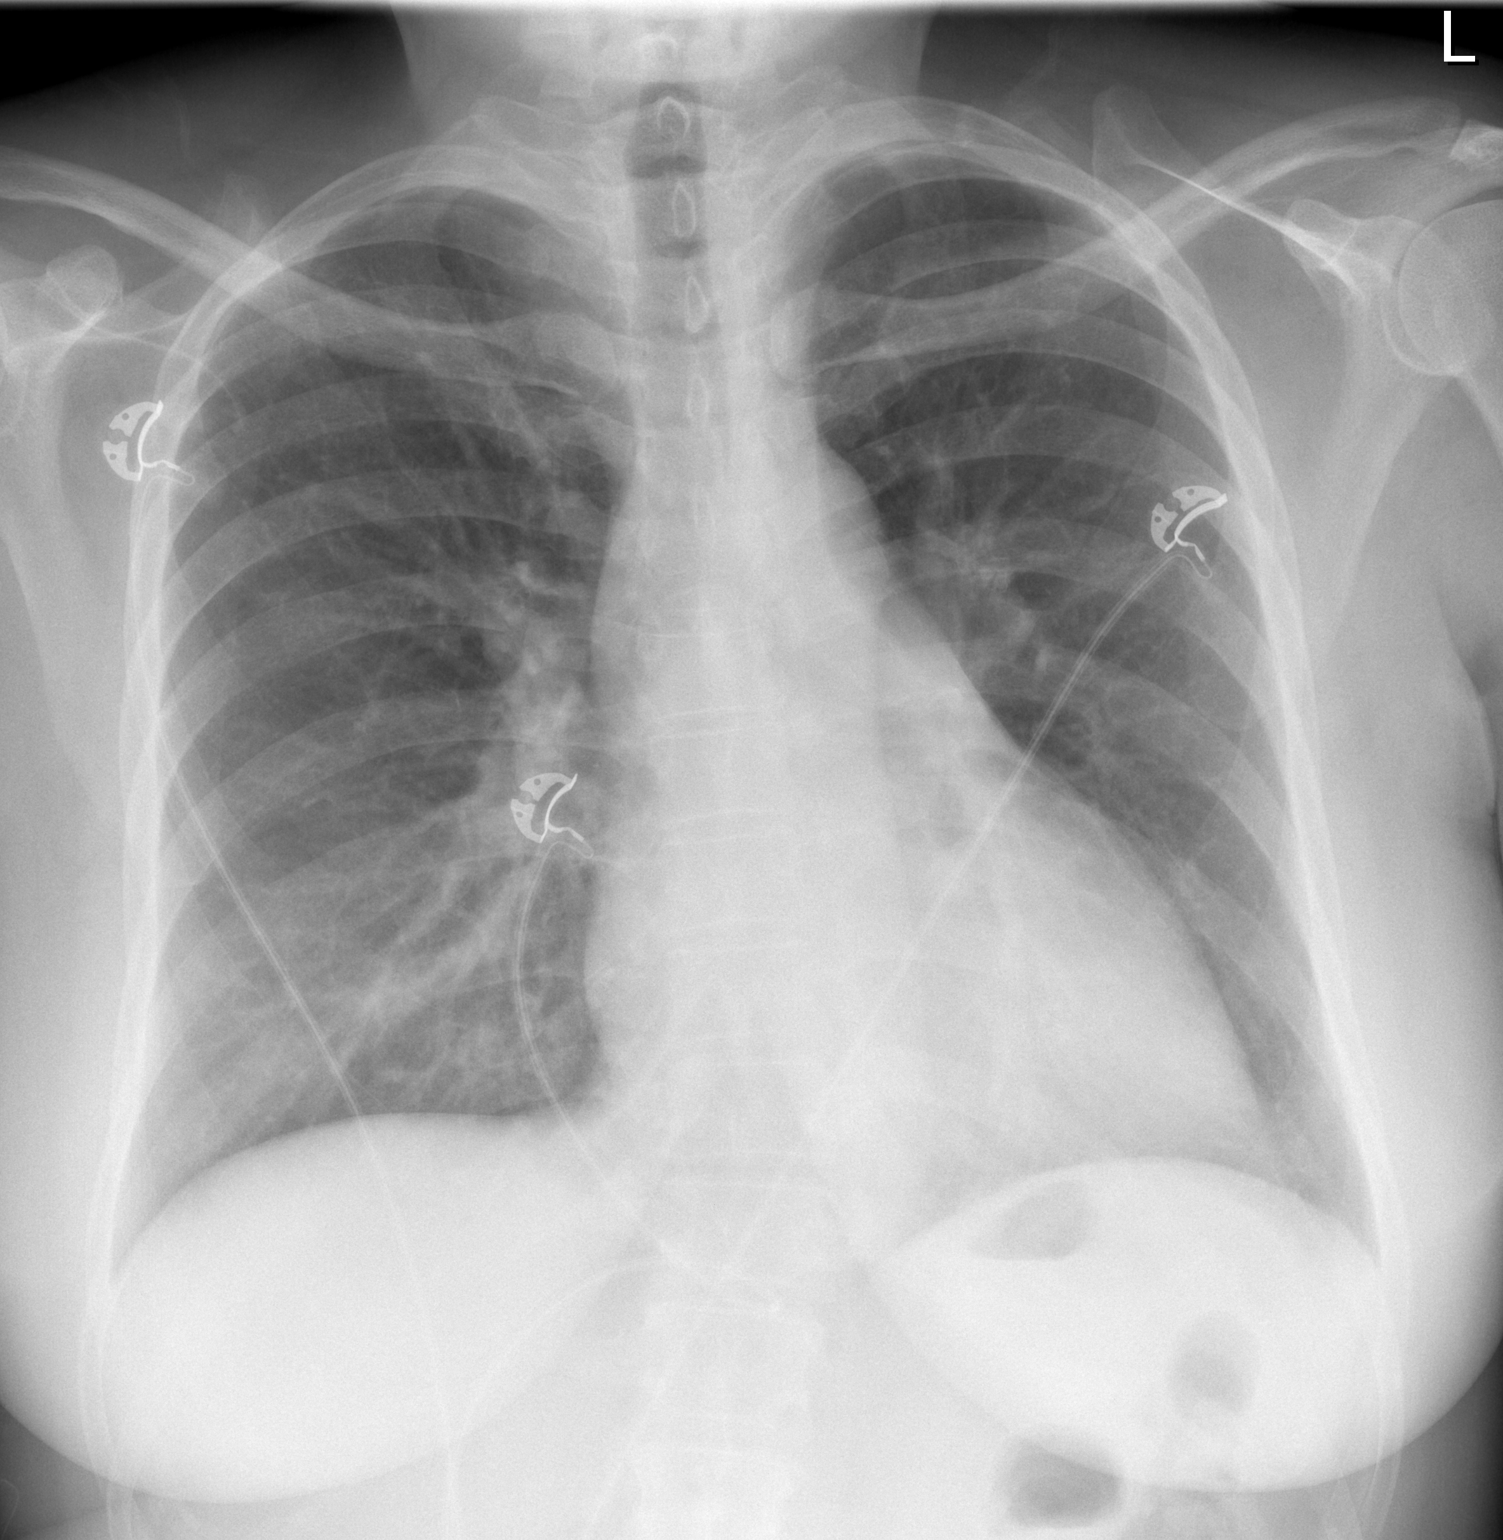

[w chest lat]
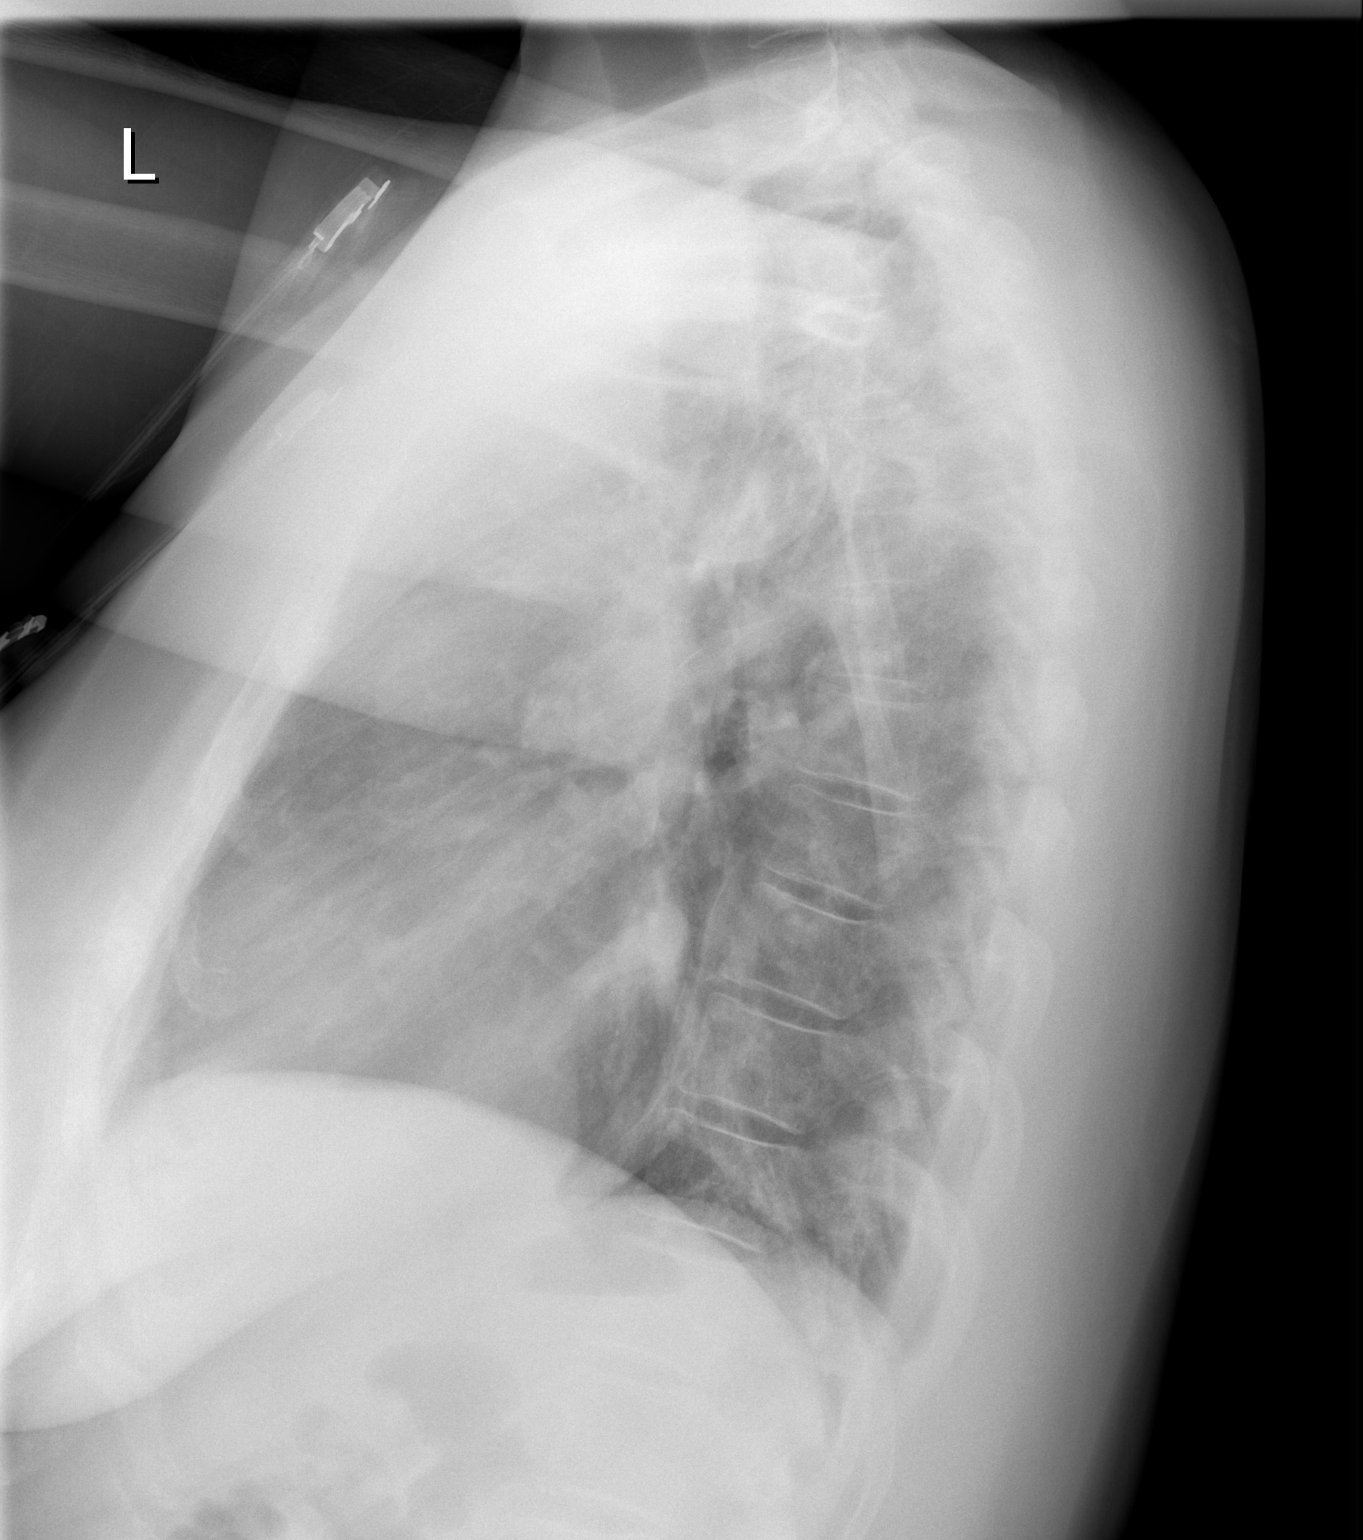

[2 of 2 positions shown; findings below may reference images not displayed]

FINDINGS: Normal heart size and pulmonary vascularity. No focal airspace
disease or consolidation in the lungs. No blunting of costophrenic
angles. No pneumothorax. Mediastinal contours appear intact.
IMPRESSION: No active cardiopulmonary disease.

## 2017-04-21 ENCOUNTER — Encounter (HOSPITAL_COMMUNITY): Payer: Self-pay

## 2017-06-14 NOTE — Patient Instructions (Signed)
Your procedure is scheduled on:  Tuesday, June 21, 2017  Enter through the Main Entrance of [facility] at:  6:00 AM  Pick up the phone at the desk and dial [phone].  Call this number if you have problems the morning of surgery: [phone].  Remember: Do NOT eat food or drink after:  Midnight Monday  Take these medicines the morning of surgery with a SIP OF WATER:  None  Bring Asthma Inhaler day of surgery  Stop ALL herbal medications at this time  Do NOT smoke the day of surgery.  Do NOT wear jewelry (body piercing), metal hair clips/bobby pins, make-up, artifical eyelashes or nail polish. Do NOT wear lotions, powders, or perfumes.  You may wear deodorant. Do NOT shave for 48 hours prior to surgery. Do NOT bring valuables to the hospital. Contacts, dentures, or bridgework may not be worn into surgery.  Leave suitcase in car.  After surgery it may be brought to your room.  For patients admitted to the hospital, checkout time is 11:00 AM the day of discharge.  Have a responsible adult drive you home and stay with you for 24 hours after your procedure  Bring a copy of your healthcare power of attorney and living will documents.

## 2017-06-16 ENCOUNTER — Ambulatory Visit (INDEPENDENT_AMBULATORY_CARE_PROVIDER_SITE_OTHER): Payer: Self-pay | Admitting: Obstetrics & Gynecology

## 2017-06-16 ENCOUNTER — Encounter: Payer: Self-pay | Admitting: Obstetrics & Gynecology

## 2017-06-16 ENCOUNTER — Inpatient Hospital Stay (HOSPITAL_COMMUNITY)
Admission: RE | Admit: 2017-06-16 | Discharge: 2017-06-16 | Disposition: A | Discharge status: Home / Self Care | Payer: Medicaid Other | Source: Ambulatory Visit

## 2017-06-16 VITALS — BP 143/77 | HR 107 | Wt 174.5 lb

## 2017-06-16 DIAGNOSIS — D252 Subserosal leiomyoma of uterus: Secondary | ICD-10-CM

## 2017-06-16 DIAGNOSIS — D250 Submucous leiomyoma of uterus: Secondary | ICD-10-CM

## 2017-06-16 DIAGNOSIS — N939 Abnormal uterine and vaginal bleeding, unspecified: Secondary | ICD-10-CM

## 2017-06-16 NOTE — Progress Notes (Signed)
History:  36 y.o. No obstetric history on file. here today for preop visit for hyst for AUB and uterine fibroids. Pt reports that the bleeding had improved on Megace and she stopped it and the bleeding returned. She does not want to be on long term meds and continues to request definitive management.  The following portions of the patient's history were reviewed and updated as appropriate: allergies, current medications, past family history, past medical history, past social history, past surgical history and problem list.  Review of Systems:  Pertinent items are noted in HPI.   Objective:  Physical Exam Blood pressure (!) 143/77, pulse (!) 107, weight 174 lb 8 oz (79.2 kg).  CONSTITUTIONAL: Well-developed, well-nourished female in no acute distress.  HENT:  Normocephalic, atraumatic EYES: Conjunctivae and EOM are normal. No scleral icterus.  NECK: Normal range of motion SKIN: Skin is warm and dry. No rash noted. Not diaphoretic.No pallor. [place]: Alert and oriented to person, place, and time. Normal coordination.  Abd: Soft, nontender and nondistended; well healed transverse incision Pelvic: Normal appearing external genitalia; normal appearing vaginal mucosa and cervix.  Normal discharge.  12-14 weeks sized uterus, no other palpable masses, no uterine or adnexal tenderness  Labs and Imaging 03/07/2017 CLINICAL DATA:  Abnormal uterine bleeding.  Fibroids.  EXAM: TRANSABDOMINAL AND TRANSVAGINAL ULTRASOUND OF PELVIS  TECHNIQUE: Both transabdominal and transvaginal ultrasound examinations of the pelvis were performed. Transabdominal technique was performed for global imaging of the pelvis including uterus, ovaries, adnexal regions, and pelvic cul-de-sac. It was necessary to proceed with endovaginal exam following the transabdominal exam to visualize the endometrium.  COMPARISON:  None  FINDINGS: Uterus  Measurements: 9.5 x 5.9 x 8.6 cm diffusely heterogeneous.  Multiple sub endometrial echogenic nodules are identified. There is a subserosal fibroid arising from the anterior fundus measuring 1.6 x 1.8 by 1.5 cm.  Endometrium  Thickness: 9.4 mm.  No focal abnormality visualized.  Right ovary  Measurements: 2.0 x 2.2 x 1.8 cm. Normal appearance/no adnexal mass.  Left ovary  Measurements: 2.9 x 1.8 x 2.0 cm. Normal appearance/no adnexal mass.  Other findings  No abnormal free fluid.  IMPRESSION: 1. Heterogeneous myometrium and multiple sub endometrial echogenic nodules are identified. Findings are compatible with adenomyosis. 2. Small subserosal fibroid arises from the anterior uterine fundus.   Assessment & Plan:  Symptomatic uterine fibroids causing menorrhagia   I discussed the modes of hyst with pt and she desires surgical management with laparoscopic assisted vaginal hysterectomy with bilateral salpingecotmy.  The risks of surgery were discussed in detail with the patient including but not limited to: bleeding which may require transfusion or reoperation; infection which may require prolonged hospitalization or re-hospitalization and antibiotic therapy; injury to bowel, bladder, ureters and major vessels or other surrounding organs; need for additional procedures including laparotomy; thromboembolic phenomenon, incisional problems and other postoperative or anesthesia complications.  Patient was told that the likelihood that her condition and symptoms will be treated effectively with this surgical management was very high; the postoperative expectations were also discussed in detail. The patient also understands the alternative treatment options which were discussed in full. All questions were answered.  She missed her pre op appt today due to this appt and will be reschedule.  Total face-to-face time with patient was 20 min.  Greater than 50% was spent in counseling and coordination of care with the patient.   Hiroyuki Ozanich L.  Harraway-Smith, M.D., [facility]

## 2017-06-17 ENCOUNTER — Encounter: Payer: Self-pay | Admitting: Obstetrics & Gynecology

## 2017-06-17 NOTE — Patient Instructions (Signed)

## 2017-06-20 ENCOUNTER — Encounter (HOSPITAL_COMMUNITY): Payer: Self-pay

## 2017-06-20 ENCOUNTER — Encounter (HOSPITAL_COMMUNITY)
Admission: RE | Admit: 2017-06-20 | Discharge: 2017-06-20 | Disposition: A | Discharge status: Home / Self Care | Payer: Medicaid Other | Source: Ambulatory Visit | Attending: Obstetrics & Gynecology | Admitting: Obstetrics & Gynecology

## 2017-06-20 DIAGNOSIS — Z01812 Encounter for preprocedural laboratory examination: Secondary | ICD-10-CM | POA: Diagnosis present

## 2017-06-20 HISTORY — DX: Thyrotoxicosis, unspecified without thyrotoxic crisis or storm: E05.90

## 2017-06-20 LAB — CBC
HCT: 35.4 % — ABNORMAL LOW (ref 36.0–46.0)
HEMOGLOBIN: 11.6 g/dL — AB (ref 12.0–15.0)
MCH: 24 pg — AB (ref 26.0–34.0)
MCHC: 32.8 g/dL (ref 30.0–36.0)
MCV: 73.1 fL — AB (ref 78.0–100.0)
PLATELETS: 212 10*3/uL (ref 150–400)
RBC: 4.84 MIL/uL (ref 3.87–5.11)
RDW: 14.8 % (ref 11.5–15.5)
WBC: 5.6 10*3/uL (ref 4.0–10.5)

## 2017-06-20 LAB — TYPE AND SCREEN
ABO/RH(D): O POS
Antibody Screen: NEGATIVE

## 2017-06-20 LAB — ABO/RH: ABO/RH(D): O POS

## 2017-06-20 NOTE — Patient Instructions (Addendum)
Your procedure is scheduled on:  Tuesday, July 10   Enter through the [employer] [facility] at: 6 AM  Pick up the phone at the desk and dial [phone].  Call this number if you have problems the morning of surgery: [phone].  Remember: Do NOT eat food or drink clear liquids (including water) after:  Midnight tonight-Monday  Take these medicines the morning of surgery with a SIP OF WATER:  Omeprazole  Stop herbal medications and supplements at this time.  Do NOT wear jewelry (body piercing), metal hair clips/bobby pins, make-up, or nail polish. Do NOT wear lotions, powders, or perfumes.  You may wear deoderant. Do NOT shave for 48 hours prior to surgery. Do NOT bring valuables to the hospital.   Leave suitcase in car.  After surgery it may be brought to your room.  For patients admitted to the hospital, checkout time is 11:00 AM the day of discharge. Home with [place] cell [phone]

## 2017-06-21 ENCOUNTER — Encounter (HOSPITAL_COMMUNITY)
Admission: RE | Disposition: A | Discharge status: Home / Self Care | Payer: Self-pay | Source: Ambulatory Visit | Attending: Obstetrics & Gynecology

## 2017-06-21 ENCOUNTER — Ambulatory Visit (HOSPITAL_COMMUNITY): Payer: Medicaid Other | Admitting: Anesthesiology

## 2017-06-21 ENCOUNTER — Encounter (HOSPITAL_COMMUNITY): Payer: Self-pay | Admitting: *Deleted

## 2017-06-21 ENCOUNTER — Observation Stay (HOSPITAL_COMMUNITY)
Admission: RE | Admit: 2017-06-21 | Discharge: 2017-06-22 | Disposition: A | Discharge status: Home / Self Care | Payer: Medicaid Other | Source: Ambulatory Visit | Attending: Obstetrics & Gynecology | Admitting: Obstetrics & Gynecology

## 2017-06-21 DIAGNOSIS — R102 Pelvic and perineal pain: Secondary | ICD-10-CM | POA: Insufficient documentation

## 2017-06-21 DIAGNOSIS — N840 Polyp of corpus uteri: Secondary | ICD-10-CM | POA: Diagnosis not present

## 2017-06-21 DIAGNOSIS — N83201 Unspecified ovarian cyst, right side: Secondary | ICD-10-CM | POA: Insufficient documentation

## 2017-06-21 DIAGNOSIS — D250 Submucous leiomyoma of uterus: Secondary | ICD-10-CM | POA: Diagnosis not present

## 2017-06-21 DIAGNOSIS — N938 Other specified abnormal uterine and vaginal bleeding: Secondary | ICD-10-CM | POA: Diagnosis not present

## 2017-06-21 DIAGNOSIS — K219 Gastro-esophageal reflux disease without esophagitis: Secondary | ICD-10-CM | POA: Diagnosis not present

## 2017-06-21 DIAGNOSIS — N939 Abnormal uterine and vaginal bleeding, unspecified: Secondary | ICD-10-CM | POA: Diagnosis not present

## 2017-06-21 DIAGNOSIS — Z9889 Other specified postprocedural states: Secondary | ICD-10-CM

## 2017-06-21 DIAGNOSIS — Z79899 Other long term (current) drug therapy: Secondary | ICD-10-CM | POA: Diagnosis not present

## 2017-06-21 DIAGNOSIS — N800 Endometriosis of uterus: Secondary | ICD-10-CM | POA: Diagnosis not present

## 2017-06-21 HISTORY — PX: LAPAROSCOPIC VAGINAL HYSTERECTOMY WITH SALPINGECTOMY: SHX6680

## 2017-06-21 HISTORY — PX: CYSTOSCOPY: SHX5120

## 2017-06-21 LAB — PREGNANCY, URINE: Preg Test, Ur: NEGATIVE

## 2017-06-21 SURGERY — HYSTERECTOMY, VAGINAL, LAPAROSCOPY-ASSISTED, WITH SALPINGECTOMY
Anesthesia: General | Laterality: Bilateral

## 2017-06-21 MED ORDER — DEXAMETHASONE SODIUM PHOSPHATE 10 MG/ML IJ SOLN
INTRAMUSCULAR | Status: AC
Start: 2017-06-21 — End: 2017-06-21
  Filled 2017-06-21: qty 1

## 2017-06-21 MED ORDER — MIDAZOLAM HCL 2 MG/2ML IJ SOLN
INTRAMUSCULAR | Status: AC
  Filled 2017-06-21: qty 2

## 2017-06-21 MED ORDER — KETOROLAC TROMETHAMINE 30 MG/ML IJ SOLN
30.0000 mg | Freq: Four times a day (QID) | INTRAMUSCULAR | Status: DC
  Administered 2017-06-21 – 2017-06-22 (×4): 30 mg via INTRAVENOUS
  Filled 2017-06-21 (×3): qty 1

## 2017-06-21 MED ORDER — ROCURONIUM BROMIDE 100 MG/10ML IV SOLN
INTRAVENOUS | Status: DC | PRN
  Administered 2017-06-21: 20 mg via INTRAVENOUS
  Administered 2017-06-21: 50 mg via INTRAVENOUS
  Administered 2017-06-21: 20 mg via INTRAVENOUS

## 2017-06-21 MED ORDER — LACTATED RINGERS IV SOLN
INTRAVENOUS | Status: DC
  Administered 2017-06-21: 09:00:00 via INTRAVENOUS
  Administered 2017-06-21: 125 mL/h via INTRAVENOUS
  Administered 2017-06-21 (×2): via INTRAVENOUS

## 2017-06-21 MED ORDER — ROCURONIUM BROMIDE 100 MG/10ML IV SOLN
INTRAVENOUS | Status: AC
  Filled 2017-06-21: qty 1

## 2017-06-21 MED ORDER — DEXTROSE-NACL 5-0.45 % IV SOLN
INTRAVENOUS | Status: DC
  Administered 2017-06-21 – 2017-06-22 (×3): via INTRAVENOUS

## 2017-06-21 MED ORDER — ONDANSETRON HCL 4 MG/2ML IJ SOLN
4.0000 mg | Freq: Four times a day (QID) | INTRAMUSCULAR | Status: AC | PRN
  Administered 2017-06-21: 4 mg via INTRAVENOUS

## 2017-06-21 MED ORDER — ESTRADIOL 0.1 MG/GM VA CREA
TOPICAL_CREAM | VAGINAL | Status: AC
Start: 2017-06-21 — End: 2017-06-21
  Filled 2017-06-21: qty 42.5

## 2017-06-21 MED ORDER — ONDANSETRON HCL 4 MG/2ML IJ SOLN
INTRAMUSCULAR | Status: AC
Start: 2017-06-21 — End: 2017-06-21
  Filled 2017-06-21: qty 2

## 2017-06-21 MED ORDER — ONDANSETRON HCL 4 MG/2ML IJ SOLN: 4.0000 mg | Freq: Four times a day (QID) | INTRAMUSCULAR | Status: DC | PRN

## 2017-06-21 MED ORDER — HYDROMORPHONE HCL 1 MG/ML IJ SOLN
INTRAMUSCULAR | Status: AC
  Administered 2017-06-21: 0.5 mg via INTRAVENOUS
  Filled 2017-06-21: qty 2

## 2017-06-21 MED ORDER — SIMETHICONE 80 MG PO CHEW
80.0000 mg | CHEWABLE_TABLET | Freq: Four times a day (QID) | ORAL | Status: DC | PRN
  Administered 2017-06-22: 80 mg via ORAL
  Filled 2017-06-21: qty 1

## 2017-06-21 MED ORDER — SUGAMMADEX SODIUM 200 MG/2ML IV SOLN
INTRAVENOUS | Status: AC
  Filled 2017-06-21: qty 2

## 2017-06-21 MED ORDER — ALBUTEROL SULFATE (2.5 MG/3ML) 0.083% IN NEBU: 2.5000 mg | INHALATION_SOLUTION | Freq: Four times a day (QID) | RESPIRATORY_TRACT | Status: DC | PRN

## 2017-06-21 MED ORDER — PROPOFOL 10 MG/ML IV BOLUS
INTRAVENOUS | Status: AC
  Filled 2017-06-21: qty 20

## 2017-06-21 MED ORDER — SODIUM CHLORIDE 0.9 % IJ SOLN
INTRAMUSCULAR | Status: AC
  Filled 2017-06-21: qty 20

## 2017-06-21 MED ORDER — METHYLENE BLUE 0.5 % INJ SOLN
INTRAVENOUS | Status: AC
  Filled 2017-06-21: qty 10

## 2017-06-21 MED ORDER — IBUPROFEN 600 MG PO TABS: 600.0000 mg | ORAL_TABLET | Freq: Four times a day (QID) | ORAL | Status: DC | PRN

## 2017-06-21 MED ORDER — LIDOCAINE HCL (CARDIAC) 20 MG/ML IV SOLN
INTRAVENOUS | Status: AC
  Filled 2017-06-21: qty 5

## 2017-06-21 MED ORDER — ONDANSETRON HCL 4 MG/2ML IJ SOLN
INTRAMUSCULAR | Status: DC | PRN
  Administered 2017-06-21: 4 mg via INTRAVENOUS

## 2017-06-21 MED ORDER — SCOPOLAMINE 1 MG/3DAYS TD PT72
MEDICATED_PATCH | TRANSDERMAL | Status: AC
  Administered 2017-06-21: 1.5 mg via TRANSDERMAL
  Filled 2017-06-21: qty 1

## 2017-06-21 MED ORDER — ONDANSETRON HCL 4 MG PO TABS: 4.0000 mg | ORAL_TABLET | Freq: Four times a day (QID) | ORAL | Status: DC | PRN

## 2017-06-21 MED ORDER — BISACODYL 10 MG RE SUPP: 10.0000 mg | Freq: Every day | RECTAL | Status: DC | PRN

## 2017-06-21 MED ORDER — HYDROMORPHONE HCL 1 MG/ML IJ SOLN
0.2500 mg | INTRAMUSCULAR | Status: DC | PRN
  Administered 2017-06-21 (×3): 0.5 mg via INTRAVENOUS

## 2017-06-21 MED ORDER — PROPOFOL 10 MG/ML IV BOLUS
INTRAVENOUS | Status: DC | PRN
  Administered 2017-06-21: 200 mg via INTRAVENOUS
  Administered 2017-06-21: 100 mg via INTRAVENOUS

## 2017-06-21 MED ORDER — FENTANYL CITRATE (PF) 100 MCG/2ML IJ SOLN
INTRAMUSCULAR | Status: AC
  Filled 2017-06-21: qty 2

## 2017-06-21 MED ORDER — MIDAZOLAM HCL 2 MG/2ML IJ SOLN
INTRAMUSCULAR | Status: DC | PRN
  Administered 2017-06-21: 2 mg via INTRAVENOUS

## 2017-06-21 MED ORDER — DIPHENHYDRAMINE HCL 12.5 MG/5ML PO ELIX: 12.5000 mg | ORAL_SOLUTION | Freq: Four times a day (QID) | ORAL | Status: DC | PRN

## 2017-06-21 MED ORDER — PROPYLTHIOURACIL 50 MG PO TABS
50.0000 mg | ORAL_TABLET | Freq: Three times a day (TID) | ORAL | Status: DC
  Administered 2017-06-21 – 2017-06-22 (×3): 50 mg via ORAL
  Filled 2017-06-21 (×6): qty 1

## 2017-06-21 MED ORDER — SODIUM CHLORIDE 0.9 % IJ SOLN
INTRAMUSCULAR | Status: AC
  Filled 2017-06-21: qty 100

## 2017-06-21 MED ORDER — CEFAZOLIN SODIUM-DEXTROSE 2-4 GM/100ML-% IV SOLN
2.0000 g | INTRAVENOUS | Status: AC
  Administered 2017-06-21: 2 g via INTRAVENOUS

## 2017-06-21 MED ORDER — SUGAMMADEX SODIUM 200 MG/2ML IV SOLN
INTRAVENOUS | Status: DC | PRN
  Administered 2017-06-21: 200 mg via INTRAVENOUS

## 2017-06-21 MED ORDER — NALOXONE HCL 0.4 MG/ML IJ SOLN
0.4000 mg | INTRAMUSCULAR | Status: DC | PRN
Start: 2017-06-21 — End: 2017-06-22

## 2017-06-21 MED ORDER — SODIUM CHLORIDE 0.9% FLUSH: 9.0000 mL | INTRAVENOUS | Status: DC | PRN

## 2017-06-21 MED ORDER — SCOPOLAMINE 1 MG/3DAYS TD PT72
1.0000 | MEDICATED_PATCH | Freq: Once | TRANSDERMAL | Status: DC
  Administered 2017-06-21: 1.5 mg via TRANSDERMAL

## 2017-06-21 MED ORDER — OXYCODONE HCL 5 MG PO TABS: 5.0000 mg | ORAL_TABLET | Freq: Once | ORAL | Status: DC | PRN

## 2017-06-21 MED ORDER — DEXAMETHASONE SODIUM PHOSPHATE 10 MG/ML IJ SOLN
INTRAMUSCULAR | Status: DC | PRN
  Administered 2017-06-21: 10 mg via INTRAVENOUS

## 2017-06-21 MED ORDER — VASOPRESSIN 20 UNIT/ML IV SOLN
INTRAVENOUS | Status: DC | PRN
  Administered 2017-06-21: 28 mL via INTRAMUSCULAR

## 2017-06-21 MED ORDER — HYDROMORPHONE 1 MG/ML IV SOLN
INTRAVENOUS | Status: DC
  Administered 2017-06-21: 0.2 mg via INTRAVENOUS
  Administered 2017-06-21: 15:00:00 via INTRAVENOUS
  Administered 2017-06-21: 0.4 mg via INTRAVENOUS
  Administered 2017-06-22 (×2): 0.2 mg via INTRAVENOUS
  Filled 2017-06-21: qty 25

## 2017-06-21 MED ORDER — METHYLENE BLUE 0.5 % INJ SOLN
INTRAVENOUS | Status: DC | PRN
  Administered 2017-06-21: 50 mg via INTRAVENOUS

## 2017-06-21 MED ORDER — KETOROLAC TROMETHAMINE 30 MG/ML IJ SOLN
30.0000 mg | Freq: Four times a day (QID) | INTRAMUSCULAR | Status: DC
  Filled 2017-06-21: qty 1

## 2017-06-21 MED ORDER — POLYETHYLENE GLYCOL 3350 17 G PO PACK: 17.0000 g | PACK | Freq: Every day | ORAL | Status: DC | PRN

## 2017-06-21 MED ORDER — DOCUSATE SODIUM 100 MG PO CAPS
100.0000 mg | ORAL_CAPSULE | Freq: Two times a day (BID) | ORAL | Status: DC
  Administered 2017-06-21 – 2017-06-22 (×2): 100 mg via ORAL
  Filled 2017-06-21 (×2): qty 1

## 2017-06-21 MED ORDER — BUPIVACAINE HCL (PF) 0.5 % IJ SOLN
INTRAMUSCULAR | Status: DC | PRN
  Administered 2017-06-21: 30 mL

## 2017-06-21 MED ORDER — LIDOCAINE HCL (CARDIAC) 20 MG/ML IV SOLN
INTRAVENOUS | Status: DC | PRN
  Administered 2017-06-21: 30 mg via INTRAVENOUS

## 2017-06-21 MED ORDER — ONDANSETRON HCL 4 MG/2ML IJ SOLN
INTRAMUSCULAR | Status: AC
  Filled 2017-06-21: qty 2

## 2017-06-21 MED ORDER — BUPIVACAINE HCL (PF) 0.5 % IJ SOLN
INTRAMUSCULAR | Status: AC
  Filled 2017-06-21: qty 30

## 2017-06-21 MED ORDER — LACTATED RINGERS IR SOLN
Status: DC | PRN
  Administered 2017-06-21: 3000 mL

## 2017-06-21 MED ORDER — VITAMIN D3 25 MCG (1000 UNIT) PO TABS
5000.0000 [IU] | ORAL_TABLET | Freq: Every day | ORAL | Status: DC
  Administered 2017-06-21 – 2017-06-22 (×2): 5000 [IU] via ORAL
  Filled 2017-06-21 (×3): qty 5

## 2017-06-21 MED ORDER — DIPHENHYDRAMINE HCL 50 MG/ML IJ SOLN: 12.5000 mg | Freq: Four times a day (QID) | INTRAMUSCULAR | Status: DC | PRN

## 2017-06-21 MED ORDER — FENTANYL CITRATE (PF) 250 MCG/5ML IJ SOLN
INTRAMUSCULAR | Status: AC
  Filled 2017-06-21: qty 5

## 2017-06-21 MED ORDER — FENTANYL CITRATE (PF) 100 MCG/2ML IJ SOLN
INTRAMUSCULAR | Status: DC | PRN
  Administered 2017-06-21: 50 ug via INTRAVENOUS
  Administered 2017-06-21 (×4): 100 ug via INTRAVENOUS

## 2017-06-21 MED ORDER — PANTOPRAZOLE SODIUM 40 MG PO TBEC
40.0000 mg | DELAYED_RELEASE_TABLET | Freq: Every day | ORAL | Status: DC
Start: 2017-06-21 — End: 2017-06-22
  Administered 2017-06-21 – 2017-06-22 (×2): 40 mg via ORAL
  Filled 2017-06-21 (×2): qty 1

## 2017-06-21 MED ORDER — OXYCODONE HCL 5 MG/5ML PO SOLN: 5.0000 mg | Freq: Once | ORAL | Status: DC | PRN

## 2017-06-21 MED ORDER — VASOPRESSIN 20 UNIT/ML IV SOLN
INTRAVENOUS | Status: AC
  Filled 2017-06-21: qty 1

## 2017-06-21 MED ORDER — LACTATED RINGERS IV SOLN
INTRAVENOUS | Status: DC
  Administered 2017-06-21: 07:00:00 via INTRAVENOUS

## 2017-06-21 SURGICAL SUPPLY — 52 items
BNDG GAUZE ELAST 4 BULKY (GAUZE/BANDAGES/DRESSINGS) ×2 IMPLANT
CABLE HIGH FREQUENCY MONO STRZ (ELECTRODE) IMPLANT
CLOTH BEACON ORANGE TIMEOUT ST (SAFETY) ×4 IMPLANT
CONT PATH 16OZ SNAP LID 3702 (MISCELLANEOUS) ×2 IMPLANT
COVER BACK TABLE 60X90IN (DRAPES) ×4 IMPLANT
DECANTER SPIKE VIAL GLASS SM (MISCELLANEOUS) ×2 IMPLANT
DEFOGGER SCOPE WARMER CLEARIFY (MISCELLANEOUS) ×2 IMPLANT
DRSG OPSITE POSTOP 3X4 (GAUZE/BANDAGES/DRESSINGS) IMPLANT
DURAPREP 26ML APPLICATOR (WOUND CARE) ×4 IMPLANT
ELECT REM PT RETURN 9FT ADLT (ELECTROSURGICAL) ×4
ELECTRODE REM PT RTRN 9FT ADLT (ELECTROSURGICAL) ×2 IMPLANT
GAUZE PACKING 2X5 YD STRL (GAUZE/BANDAGES/DRESSINGS) IMPLANT
GAUZE SPONGE 4X4 16PLY XRAY LF (GAUZE/BANDAGES/DRESSINGS) ×2 IMPLANT
GLOVE BIO SURGEON STRL SZ7 (GLOVE) ×12 IMPLANT
GLOVE BIOGEL PI IND STRL 6.5 (GLOVE) ×2 IMPLANT
GLOVE BIOGEL PI IND STRL 7.0 (GLOVE) ×8 IMPLANT
GLOVE BIOGEL PI INDICATOR 6.5 (GLOVE) ×2
GLOVE BIOGEL PI INDICATOR 7.0 (GLOVE) ×8
GOWN STRL REUS W/TWL XL LVL3 (GOWN DISPOSABLE) ×10 IMPLANT
LEGGING LITHOTOMY PAIR STRL (DRAPES) ×4 IMPLANT
LIGASURE VESSEL 5MM BLUNT TIP (ELECTROSURGICAL) ×2 IMPLANT
MANIPULATOR UTERINE 4.5 ZUMI (MISCELLANEOUS) ×4 IMPLANT
NS IRRIG 1000ML POUR BTL (IV SOLUTION) ×2 IMPLANT
PACK LAVH (CUSTOM PROCEDURE TRAY) ×4 IMPLANT
PACK ROBOTIC GOWN (GOWN DISPOSABLE) ×4 IMPLANT
PACK TRENDGUARD 450 HYBRID PRO (MISCELLANEOUS) IMPLANT
POUCH LAPAROSCOPIC INSTRUMENT (MISCELLANEOUS) ×2 IMPLANT
PROTECTOR NERVE ULNAR (MISCELLANEOUS) ×8 IMPLANT
SCISSORS LAP 5X35 DISP (ENDOMECHANICALS) IMPLANT
SET CYSTO W/LG BORE CLAMP LF (SET/KITS/TRAYS/PACK) ×4 IMPLANT
SET IRRIG TUBING LAPAROSCOPIC (IRRIGATION / IRRIGATOR) ×2 IMPLANT
SHEARS FOC LG CVD HARMONIC 17C (MISCELLANEOUS) IMPLANT
SHEARS HARMONIC ACE PLUS 36CM (ENDOMECHANICALS) ×4 IMPLANT
SLEEVE XCEL OPT CAN 5 100 (ENDOMECHANICALS) ×8 IMPLANT
SOLUTION ELECTROLUBE (MISCELLANEOUS) IMPLANT
SUT VIC AB 0 CT1 18XCR BRD8 (SUTURE) ×4 IMPLANT
SUT VIC AB 0 CT1 36 (SUTURE) ×8 IMPLANT
SUT VIC AB 0 CT1 8-18 (SUTURE) ×8
SUT VIC AB 3-0 SH 18 (SUTURE) IMPLANT
SUT VIC AB 3-0 X1 27 (SUTURE) ×4 IMPLANT
SUT VICRYL 0 TIES 12 18 (SUTURE) ×4 IMPLANT
SUT VICRYL 0 UR6 27IN ABS (SUTURE) IMPLANT
SUT VICRYL 4-0 PS2 18IN ABS (SUTURE) ×4 IMPLANT
SYR 50ML LL SCALE MARK (SYRINGE) ×2 IMPLANT
SYR 5ML LL (SYRINGE) ×2 IMPLANT
SYRINGE 10CC LL (SYRINGE) ×4 IMPLANT
TOWEL OR 17X24 6PK STRL BLUE (TOWEL DISPOSABLE) ×8 IMPLANT
TRAY FOLEY CATH SILVER 14FR (SET/KITS/TRAYS/PACK) ×4 IMPLANT
TRENDGUARD 450 HYBRID PRO PACK (MISCELLANEOUS) ×4
TROCAR OPTI TIP 5M 100M (ENDOMECHANICALS) ×4 IMPLANT
TROCAR XCEL NON-BLD 5MMX100MML (ENDOMECHANICALS) ×2 IMPLANT
WARMER LAPAROSCOPE (MISCELLANEOUS) ×4 IMPLANT

## 2017-06-21 NOTE — Anesthesia Preprocedure Evaluation (Signed)
Anesthesia Evaluation  Patient identified by MRN, date of birth, ID band Patient awake    Reviewed: Allergy & Precautions, H&P , NPO status , Patient's Chart, lab work & pertinent test results  Airway Mallampati: II   Neck ROM: full    Dental   Pulmonary    breath sounds clear to auscultation       Cardiovascular negative cardio ROS   Rhythm:regular Rate:Normal     Neuro/Psych  Headaches,    GI/Hepatic GERD  ,  Endo/Other  Hyperthyroidism obese  Renal/GU      Musculoskeletal   Abdominal   Peds  Hematology   Anesthesia Other Findings   Reproductive/Obstetrics                             Anesthesia Physical Anesthesia Plan  ASA: II  Anesthesia Plan: General   Post-op Pain Management:    Induction: Intravenous  PONV Risk Score and Plan: 4 or greater and Ondansetron, Dexamethasone, Propofol, Midazolam, Scopolamine patch - Pre-op and Treatment may vary due to age or medical condition  Airway Management Planned: Oral ETT  Additional Equipment:   Intra-op Plan:   Post-operative Plan: Extubation in OR  Informed Consent: I have reviewed the patients History and Physical, chart, labs and discussed the procedure including the risks, benefits and alternatives for the proposed anesthesia with the patient or authorized representative who has indicated his/her understanding and acceptance.     Plan Discussed with: CRNA, Anesthesiologist and Surgeon  Anesthesia Plan Comments:         Anesthesia Quick Evaluation

## 2017-06-21 NOTE — Op Note (Addendum)
06/21/2017  10:54 AM  PATIENT:  Cassandra Ellis  36 y.o. female  PRE-OPERATIVE DIAGNOSIS:  DUB; fibroids pelvic pain  POST-OPERATIVE DIAGNOSIS:  DUB; fibroids pelvic pain and right ovarian cyst  PROCEDURE:  Procedure(s) with comments: LAPAROSCOPIC ASSISTED VAGINAL HYSTERECTOMY WITH BILATERAL SALPINGECTOMY (Bilateral) - bilateral saplingectomy  CYSTOSCOPY  SURGEON:  Surgeon(s) and Role:    * Lavonia Drafts, MD - Primary    * Chancy Milroy, MD - Assisting  ANESTHESIA:   general  EBL:  Total I/O In: 2700 [I.V.:2700] Out: 1500 [Urine:700; Blood:800]  BLOOD ADMINISTERED:none  DRAINS: none   LOCAL MEDICATIONS USED:  MARCAINE     SPECIMEN:  Source of Specimen:  uterus with fallopian tubes bilaterally  DISPOSITION OF SPECIMEN:  PATHOLOGY  COUNTS:  YES  TOURNIQUET:  * No tourniquets in log *  DICTATION: .Note written in EPIC  PLAN OF CARE: Admit for overnight observation  PATIENT DISPOSITION:  PACU - hemodynamically stable.   Delay start of Pharmacological VTE agent (>24hrs) due to surgical blood loss or risk of bleeding: yes  Complications: none immediate  INDICATIONS: 36yo female G2P2 with aforementioned preoprative diagnoses here today for definitive surgical management.   Risks of surgery were discussed with the patient including but not limited to: bleeding which may require transfusion or reoperation; infection which may require antibiotics; injury to bowel, bladder, ureters or other surrounding organs; need for additional procedures including laparotomy; thromboembolic phenomenon, incisional problems and other postoperative/anesthesia complications. Written informed consent was obtained.    FINDINGS: 14 week sized uterus. Uterus feels globular.  No adnexal masses palpated.  Intraoperatively there was a right ovarian cyst.  Normal left ovary and bilateral fallopian tubes.  The uterus was adherent to the anterior wall. The omentum was adherent to the upper  abdominal wall.   There was a large endometrial polyp  PROCEDURE IN DETAIL:  The patient received intravenous antibiotics and had sequential compression devices applied to her lower extremities while in the preoperative area.  She was then taken to the operating room where general anesthesia was administered and was found to be adequate.  She was placed in the dorsal lithotomy position, and was prepped and draped in a sterile manner.  A Foley catheter was inserted into her bladder and attached to constant drainage and a HUMI uterine manipulator was then advanced into the uterus .  After an adequate timeout was performed, attention was then turned to the patient's abdomen where a 5-mm skin incision was made above the umbilicus.  An optiview trocar was then placed through the incision.  After intraperitoneal placement was confirmed a pneumoperitoneum was obtained with carbon dioxide gas.  A survey of the patient's pelvis and abdomen revealed the above anatomy.   Bilateral 5-mm lower quadrant ports  were then placed under direct visualization.  The pelvis was then carefully examined.   Using the Harmonic scalpel the uterus was dissected off of the anterior abdominal wall.  On the right side, the round ligament was then clamped and transected with the Harmonic scapel device.  The uteroovarian ligament was also clamped and transected. Excellent hemostasis was noted.  The broad ligament was serially clamped using the Harmonic to the level of the uterine artieries. Excellent hemostasis was noted.  The decision was made to leave the trocars in place and proceed with completing the hysterectomy via the vaginal route .  Attention was then turned to her pelvis.  A weighted speculum was then placed in the vagina, and the anterior and posterior  lips of the cervix were grasped bilaterally with Edison Nasuti tenaculums.  The cervix was then injected circumferentially with dilute vasopressin solution.  The cervix was then  circumferentially incised, and the anterior cul-de-sac was entered sharply without difficulty and a retractor was placed.  The same procedure was performed posteriorly and the posterior cul-de-sac was entered sharply without difficulty.  A long weighted speculum was inserted into the posterior cul-de-sac.  The Heaney clamp was then used to clamp the uterosacral ligaments on either side.  They were then cut and sutured ligated with 0 Vicryl, and were held with a tag for later identification. Of note, all sutures used in this case were 0 Vicryl unless otherwise noted.   The cardinal ligaments were then clamped, cut and ligated bilaterally. The uterine vessels and broad ligaments were then serially clamped with the Heaney clamps, cut, and suture ligated on both sides.  The uterus was morcellated from the vagina. The uterus was noted to be freed from all ligaments and was then delivered and sent to pathology.  Excellent hemostasis was noted at this point.  After completion of the hysterectomy, all pedicles from the uterosacral ligament to the cornua were examined hemostasis was confirmed.  The uterosacral pedicles were then affixed to the ipsilateral vaginal cuff angles using 0 Vicryl sutures.  The vaginal cuff was then closed in a running locked fashion with 0 Vicryl.  All instruments were then removed from the pelvis.   At this point a cystoscopy was performed and there was bilateral flow of urine from the ureteral orifices.   Attention was then returned to her abdomen which was insufflated again with carbon dioxide gas.  The laparoscope was used to survey the operative site, and it was found to be hemostatic.   No intraoperative injury to other surrounding organs was noted. Arista was placed over the vaginal cuff and the abdomen was desufflated and all instruments were then removed from the patient's abdomen. The port sites were reapproximated with Dermabond and all of the sites were injected with 0.5% Marcaine  30cc.  The patient tolerated the procedures well.  All instruments, needles, and sponge counts were correct x 2. The patient was taken to the recovery room awake, extubated and in stable condition.   Debanhi Blaker L. Harraway-Smith, M.D., [facility]

## 2017-06-21 NOTE — Brief Op Note (Signed)
06/21/2017  10:54 AM  PATIENT:  Cassandra Ellis  36 y.o. female  PRE-OPERATIVE DIAGNOSIS:  DUB; fibroids pelvic pain  POST-OPERATIVE DIAGNOSIS:  DUB; fibroids pelvic pain and right ovarian cyst  PROCEDURE:  Procedure(s) with comments: LAPAROSCOPIC ASSISTED VAGINAL HYSTERECTOMY WITH BILATERAL SALPINGECTOMY (Bilateral) - bilateral saplingectomy  CYSTOSCOPY  SURGEON:  Surgeon(s) and Role:    * Lavonia Drafts, MD - Primary    * Chancy Milroy, MD - Assisting  ANESTHESIA:   general  EBL:  Total I/O In: 2700 [I.V.:2700] Out: 1500 [Urine:700; Blood:800]  BLOOD ADMINISTERED:none  DRAINS: none   LOCAL MEDICATIONS USED:  MARCAINE     SPECIMEN:  Source of Specimen:  uterus with fallopian tubes bilaterally  DISPOSITION OF SPECIMEN:  PATHOLOGY  COUNTS:  YES  TOURNIQUET:  * No tourniquets in log *  DICTATION: .Note written in EPIC  PLAN OF CARE: Admit for overnight observation  PATIENT DISPOSITION:  PACU - hemodynamically stable.   Delay start of Pharmacological VTE agent (>24hrs) due to surgical blood loss or risk of bleeding: yes  Complications: none immediate  Jackelynn Hosie L. Harraway-Smith, M.D., [facility]

## 2017-06-21 NOTE — Transfer of Care (Signed)
Immediate Anesthesia Transfer of Care Note  Patient: Cassandra Ellis  Procedure(s) Performed: Procedure(s) with comments: LAPAROSCOPIC ASSISTED VAGINAL HYSTERECTOMY WITH BILATERAL SALPINGECTOMY (Bilateral) - bilateral saplingectomy  CYSTOSCOPY  Patient Location: PACU  Anesthesia Type:General  Level of Consciousness: awake, alert , oriented and patient cooperative  Airway & Oxygen Therapy: Patient Spontanous Breathing and Patient connected to nasal cannula oxygen  Post-op Assessment: Report given to RN, Post -op Vital signs reviewed and stable and Patient moving all extremities X 4  Post vital signs: Reviewed and stable  Last Vitals:  Vitals:   06/21/17 0613  BP: (!) 143/88  Pulse: 82  Resp: 18  Temp: 36.6 C    Last Pain:  Vitals:   06/21/17 0613  TempSrc: Oral      Patients Stated Pain Goal: 3 (81/85/90 9311)  Complications: No apparent anesthesia complications

## 2017-06-21 NOTE — H&P (Signed)
Preoperative History and Physical  Cassandra Ellis is a 36 y.o. No obstetric history on file. here for surgical management of AUB and pelvic pain. Pt is s/p prev BTL   Proposed surgery: LAVH with bilateral salpingectomy  Past Medical History:  Diagnosis Date  . GERD (gastroesophageal reflux disease)   . Hyperthyroidism   . Thyroid disease    Past Surgical History:  Procedure Laterality Date  . CESAREAN SECTION  2001  . TUBAL LIGATION  01/27/2001  . WISDOM TOOTH EXTRACTION     OB History    No data available     Patient denies any cervical dysplasia or STIs. Prescriptions Prior to Admission  Medication Sig Dispense Refill Last Dose  . albuterol (PROVENTIL HFA;VENTOLIN HFA) 108 (90 Base) MCG/ACT inhaler Inhale 2 puffs into the lungs every 6 (six) hours as needed for wheezing or shortness of breath.   Past Month at Unknown time  . omeprazole (PRILOSEC) 20 MG capsule Take 1 capsule (20 mg total) by mouth daily. 30 capsule 0 Past Month at Unknown time  . cholecalciferol (VITAMIN D) 1000 units tablet Take 5,000 Units by mouth daily.   More than a month at Unknown time  . megestrol (MEGACE) 20 MG tablet Take 2 tablets (40 mg total) by mouth 2 (two) times daily. (Patient not taking: Reported on 03/08/2017) 120 tablet 3 More than a month at Unknown time  . megestrol (MEGACE) 40 MG tablet Take 1 tablet (40 mg total) by mouth 2 (two) times daily. Can increase to two tablets twice a day in the event of heavy bleeding (Patient not taking: Reported on 06/13/2017) 60 tablet 3 More than a month at Unknown time  . ondansetron (ZOFRAN ODT) 8 MG disintegrating tablet Take 1 tablet (8 mg total) by mouth every 8 (eight) hours as needed for nausea or vomiting. (Patient not taking: Reported on 03/08/2017) 8 tablet 0 More than a month at Unknown time  . propylthiouracil (PTU) 50 MG tablet Take 50 mg by mouth 3 (three) times daily.   More than a month at Unknown time    Allergies  Allergen Reactions  .  Milk-Related Compounds Other (See Comments)    Abd cramping and gas  . Watermelon Flavor Hives   Social History:   reports that she has never smoked. She has never used smokeless tobacco. She reports that she uses drugs, including Marijuana. She reports that she does not drink alcohol. History reviewed. No pertinent family history.  Review of Systems: Noncontributory  PHYSICAL EXAM: Blood pressure (!) 143/88, pulse 82, temperature 97.9 F (36.6 C), temperature source Oral, resp. rate 18, last menstrual period 06/03/2017, SpO2 99 %. General appearance - alert, well appearing, and in no distress Chest - clear to auscultation, no wheezes, rales or rhonchi, symmetric air entry Heart - normal rate and regular rhythm Abdomen - soft, nontender, nondistended, no masses or organomegaly Pelvic - examination not indicated Extremities - peripheral pulses normal, no pedal edema, no clubbing or cyanosis  Labs: Results for orders placed or performed during the hospital encounter of 06/21/17 (from the past 336 hour(s))  Pregnancy, urine   Collection Time: 06/21/17  6:00 AM  Result Value Ref Range   Preg Test, Ur NEGATIVE NEGATIVE  Results for orders placed or performed during the hospital encounter of 06/20/17 (from the past 336 hour(s))  CBC   Collection Time: 06/20/17  2:50 PM  Result Value Ref Range   WBC 5.6 4.0 - 10.5 K/uL   RBC 4.84 3.87 -  5.11 MIL/uL   Hemoglobin 11.6 (L) 12.0 - 15.0 g/dL   HCT 35.4 (L) 36.0 - 46.0 %   MCV 73.1 (L) 78.0 - 100.0 fL   MCH 24.0 (L) 26.0 - 34.0 pg   MCHC 32.8 30.0 - 36.0 g/dL   RDW 14.8 11.5 - 15.5 %   Platelets 212 150 - 400 K/uL  Type and screen   Collection Time: 06/20/17  2:50 PM  Result Value Ref Range   ABO/RH(D) O POS    Antibody Screen NEG    Sample Expiration 06/23/2017   ABO/Rh   Collection Time: 06/20/17  2:50 PM  Result Value Ref Range   ABO/RH(D) O POS     Imaging Studies: No results found.  Assessment: Patient Active Problem  List   Diagnosis Date Noted  . Generalized headache 03/28/2013  . Nausea and vomiting 03/28/2013  . Hypokalemia 03/28/2013  . Intractable headache 03/28/2013    Plan: Patient will undergo surgical management with LAVH with bilateral salpingectomy.   The risks of surgery were discussed in detail with the patient including but not limited to: bleeding which may require transfusion or reoperation; infection which may require antibiotics; injury to surrounding organs which may involve bowel, bladder, ureters ; need for additional procedures including laparoscopy or laparotomy; thromboembolic phenomenon, surgical site problems and other postoperative/anesthesia complications. Likelihood of success in alleviating the patient's condition was discussed. Routine postoperative instructions will be reviewed with the patient and her family in detail after surgery.  The patient concurred with the proposed plan, giving informed written consent for the surgery.  Patient has been NPO since last night she will remain NPO for procedure.  Anesthesia and OR aware.  Preoperative prophylactic antibiotics and SCDs ordered on call to the OR.  To OR when ready.  Haeven Nickle L. Ihor Dow, M.D., [facility] 06/21/2017 10:53 AM

## 2017-06-21 NOTE — Anesthesia Procedure Notes (Signed)
Procedure Name: Intubation Date/Time: 06/21/2017 7:27 AM Performed by: Gilmer Mor R Pre-anesthesia Checklist: Patient identified, Patient being monitored, Timeout performed, Emergency Drugs available and Suction available Patient Re-evaluated:Patient Re-evaluated prior to inductionOxygen Delivery Method: Circle System Utilized Preoxygenation: Pre-oxygenation with 100% oxygen Intubation Type: IV induction Ventilation: Mask ventilation without difficulty Laryngoscope Size: Mac and 3 Grade View: Grade II Tube type: Oral Tube size: 7.0 mm Number of attempts: 1 Airway Equipment and Method: stylet Placement Confirmation: ETT inserted through vocal cords under direct vision,  positive ETCO2 and breath sounds checked- equal and bilateral Secured at: 21 cm Tube secured with: Tape Dental Injury: Teeth and Oropharynx as per pre-operative assessment

## 2017-06-22 ENCOUNTER — Encounter: Payer: Self-pay | Admitting: *Deleted

## 2017-06-22 ENCOUNTER — Encounter (HOSPITAL_COMMUNITY): Payer: Self-pay | Admitting: Obstetrics & Gynecology

## 2017-06-22 DIAGNOSIS — N938 Other specified abnormal uterine and vaginal bleeding: Secondary | ICD-10-CM | POA: Diagnosis not present

## 2017-06-22 MED ORDER — IBUPROFEN 600 MG PO TABS: 600.0000 mg | ORAL_TABLET | Freq: Four times a day (QID) | ORAL | 0 refills | Status: DC | PRN

## 2017-06-22 MED ORDER — DOCUSATE SODIUM 100 MG PO CAPS: 100.0000 mg | ORAL_CAPSULE | Freq: Two times a day (BID) | ORAL | 0 refills | Status: DC

## 2017-06-22 MED ORDER — OXYCODONE-ACETAMINOPHEN 5-325 MG PO TABS: 1.0000 | ORAL_TABLET | Freq: Four times a day (QID) | ORAL | 0 refills | Status: DC | PRN

## 2017-06-22 NOTE — Progress Notes (Signed)
Discharge instructions given and prescription given, questions answered, pt states understanding. Pt signs and given copy.

## 2017-06-22 NOTE — Discharge Instructions (Signed)
Laparoscopically Assisted Vaginal Hysterectomy, Care After °Refer to this sheet in the next few weeks. These instructions provide you with information on caring for yourself after your procedure. Your health care provider may also give you more specific instructions. Your treatment has been planned according to current medical practices, but problems sometimes occur. Call your health care provider if you have any problems or questions after your procedure. °What can I expect after the procedure? °After your procedure, it is typical to have the following: °· Abdominal pain. You will be given pain medicine to control it. °· Sore throat from the breathing tube that was inserted during surgery. ° °Follow these instructions at home: °· Only take over-the-counter or prescription medicines for pain, discomfort, or fever as directed by your health care provider. °· Do not take aspirin. It can cause bleeding. °· Do not drive when taking pain medicine. °· Follow your health care provider's advice regarding diet, exercise, lifting, driving, and general activities. °· Resume your usual diet as directed and allowed. °· Get plenty of rest and sleep. °· Do not douche, use tampons, or have sexual intercourse for at least 6 weeks, or until your health care provider gives you permission. °· Change your bandages (dressings) as directed by your health care provider. °· Monitor your temperature and notify your health care provider of a fever. °· Take showers instead of baths for 2-3 weeks. °· Do not drink alcohol until your health care provider gives you permission. °· If you develop constipation, you may take a mild laxative with your health care provider's permission. Bran foods may help with constipation problems. Drinking enough fluids to keep your urine clear or pale yellow may help as well. °· Try to have someone home with you for 1-2 weeks to help around the house. °· Keep all of your follow-up appointments as directed by your  health care provider. °Contact a health care provider if: °· You have swelling, redness, or increasing pain around your incision sites. °· You have pus coming from your incision. °· You notice a bad smell coming from your incision. °· Your incision breaks open. °· You feel dizzy or lightheaded. °· You have pain or bleeding when you urinate. °· You have persistent diarrhea. °· You have persistent nausea and vomiting. °· You have abnormal vaginal discharge. °· You have a rash. °· You have any type of abnormal reaction or develop an allergy to your medicine. °· You have poor pain control with your prescribed medicine. °Get help right away if: °· You have a fever. °· You have severe abdominal pain. °· You have chest pain. °· You have shortness of breath. °· You faint. °· You have pain, swelling, or redness in your leg. °· You have heavy vaginal bleeding with blood clots. °This information is not intended to replace advice given to you by your health care provider. Make sure you discuss any questions you have with your health care provider. °Document Released: 11/18/2011 Document Revised: 05/06/2016 Document Reviewed: 06/14/2013 °Elsevier Interactive Patient Education © 2017 [employer] ° °

## 2017-06-22 NOTE — Discharge Summary (Signed)
Physician Discharge Summary  Patient ID: Cassandra Ellis MRN: [id number] DOB/AGE: 1981/01/18 36 y.o.  Admit date: 06/21/2017 Discharge date: 06/22/2017  Admission Diagnoses: AUB; pelvic pain  Discharge Diagnoses:  Principal Problem:   Abnormal uterine bleeding (AUB) Active Problems:   Pelvic pain in female   Fibroids, submucosal   Endometrial polyp   Post-operative state   Discharged Condition: good  Hospital Course: Patient had an uncomplicated surgery; for further details of this surgery, please refer to the operative note. Furthermore, the patient had an uncomplicated postoperative course.  By time of discharge, her pain was controlled on oral pain medications; she was ambulating, voiding without difficulty, tolerating regular diet and passing flatus.  She was deemed stable for discharge to home.    Significant Diagnostic Studies: labs: CBC  Treatments: IV hydration and surgery: LAVH with bilateral salpingectomy  Discharge Exam: Blood pressure 116/64, pulse 93, temperature 98 F (36.7 C), temperature source Oral, resp. rate 17, height 5\' 1"  (1.549 m), weight 172 lb 4 oz (78.1 kg), last menstrual period 06/03/2017, SpO2 100 %. General appearance: alert and no distress Resp: clear to auscultation bilaterally Cardio: regular rate and rhythm, S1, S2 normal, no murmur, click, rub or gallop GI: soft, non-tender; bowel sounds normal; no masses,  no organomegaly Extremities: extremities normal, atraumatic, no cyanosis or edema Pulses: 2+ and symmetric  Disposition: 01-Home or Self Care  Discharge Instructions    Call MD for:  difficulty breathing, headache or visual disturbances    Complete by:  As directed    Call MD for:  persistant dizziness or light-headedness    Complete by:  As directed    Call MD for:  persistant nausea and vomiting    Complete by:  As directed    Call MD for:  redness, tenderness, or signs of infection (pain, swelling, redness, odor or green/yellow  discharge around incision site)    Complete by:  As directed    Call MD for:  severe uncontrolled pain    Complete by:  As directed    Call MD for:  temperature >100.4    Complete by:  As directed    Diet - low sodium heart healthy    Complete by:  As directed    Discharge wound care:    Complete by:  As directed    Keep port sites clean and dry.  Do not remove glue.   Driving Restrictions    Complete by:  As directed    No driving for 24 hours   Increase activity slowly    Complete by:  As directed    Lifting restrictions    Complete by:  As directed    No heavy lifting for 4 weeks   Sexual Activity Restrictions    Complete by:  As directed    No sexual activity for 6 weeks     Allergies as of 06/22/2017      Reactions   Milk-related Compounds Other (See Comments)   Abd cramping and gas   Watermelon Flavor Hives      Medication List    STOP taking these medications   ondansetron 8 MG disintegrating tablet Commonly known as:  ZOFRAN ODT     TAKE these medications   albuterol 108 (90 Base) MCG/ACT inhaler Commonly known as:  PROVENTIL HFA;VENTOLIN HFA Inhale 2 puffs into the lungs every 6 (six) hours as needed for wheezing or shortness of breath.   cholecalciferol 1000 units tablet Commonly known as:  VITAMIN D Take 5,000  Units by mouth daily.   docusate sodium 100 MG capsule Commonly known as:  COLACE Take 1 capsule (100 mg total) by mouth 2 (two) times daily.   ibuprofen 600 MG tablet Commonly known as:  ADVIL,MOTRIN Take 1 tablet (600 mg total) by mouth every 6 (six) hours as needed (mild pain).   omeprazole 20 MG capsule Commonly known as:  PRILOSEC Take 1 capsule (20 mg total) by mouth daily.   oxyCODONE-acetaminophen 5-325 MG tablet Commonly known as:  PERCOCET/ROXICET Take 1-2 tablets by mouth every 6 (six) hours as needed.   propylthiouracil 50 MG tablet Commonly known as:  PTU Take 50 mg by mouth 3 (three) times daily.      Follow-up  Information    Lavonia Drafts, MD Follow up in 2 week(s).   Specialty:  Obstetrics and Gynecology Contact information: [place] [address] [phone]           Signed: Lavonia Drafts 06/22/2017, 11:52 AM

## 2017-06-24 ENCOUNTER — Inpatient Hospital Stay (HOSPITAL_COMMUNITY)
Admission: AD | Admit: 2017-06-24 | Discharge: 2017-06-25 | Disposition: A | Discharge status: Home / Self Care | Payer: Medicaid Other | Source: Ambulatory Visit | Attending: Obstetrics & Gynecology | Admitting: Obstetrics & Gynecology

## 2017-06-24 DIAGNOSIS — D649 Anemia, unspecified: Secondary | ICD-10-CM | POA: Diagnosis not present

## 2017-06-24 DIAGNOSIS — R51 Headache: Secondary | ICD-10-CM | POA: Diagnosis present

## 2017-06-24 DIAGNOSIS — T8131XA Disruption of external operation (surgical) wound, not elsewhere classified, initial encounter: Secondary | ICD-10-CM

## 2017-06-24 DIAGNOSIS — T8130XA Disruption of wound, unspecified, initial encounter: Secondary | ICD-10-CM | POA: Diagnosis not present

## 2017-06-24 DIAGNOSIS — G43009 Migraine without aura, not intractable, without status migrainosus: Secondary | ICD-10-CM | POA: Diagnosis not present

## 2017-06-24 NOTE — MAU Note (Signed)
Pt. Here for headache that started on Wednesday evening, pain 12/10.  Pt. Also concerned about incision "opening up". Pt. Self medicated this evening at 1600 today 1 percocet and 600 mg ibuprofen.  Per patient, vaginal bleeding has stop - from surgery. "My headache is the worst pain I have ever had." Patient crying.

## 2017-06-24 NOTE — Anesthesia Postprocedure Evaluation (Signed)
Anesthesia Post Note  Patient: Cassandra Ellis  Procedure(s) Performed: Procedure(s) (LRB): LAPAROSCOPIC ASSISTED VAGINAL HYSTERECTOMY WITH BILATERAL SALPINGECTOMY (Bilateral) CYSTOSCOPY     Patient location during evaluation: PACU Anesthesia Type: General Level of consciousness: awake and alert and patient cooperative Pain management: pain level controlled Vital Signs Assessment: post-procedure vital signs reviewed and stable Respiratory status: spontaneous breathing and respiratory function stable Cardiovascular status: stable Anesthetic complications: no    Last Vitals:  Vitals:   06/22/17 0800 06/22/17 1029  BP: 116/64   Pulse: 93   Resp: 16 17  Temp: 36.7 C     Last Pain:  Vitals:   06/22/17 1029  TempSrc:   PainSc: [place]

## 2017-06-25 DIAGNOSIS — G43009 Migraine without aura, not intractable, without status migrainosus: Secondary | ICD-10-CM | POA: Diagnosis not present

## 2017-06-25 LAB — CBC
HCT: 26.3 % — ABNORMAL LOW (ref 36.0–46.0)
Hemoglobin: 8.8 g/dL — ABNORMAL LOW (ref 12.0–15.0)
MCH: 24.4 pg — ABNORMAL LOW (ref 26.0–34.0)
MCHC: 33.5 g/dL (ref 30.0–36.0)
MCV: 73.1 fL — AB (ref 78.0–100.0)
PLATELETS: 219 10*3/uL (ref 150–400)
RBC: 3.6 MIL/uL — ABNORMAL LOW (ref 3.87–5.11)
RDW: 14.7 % (ref 11.5–15.5)
WBC: 7.3 10*3/uL (ref 4.0–10.5)

## 2017-06-25 LAB — URINALYSIS, ROUTINE W REFLEX MICROSCOPIC
BACTERIA UA: NONE SEEN
Bilirubin Urine: NEGATIVE
GLUCOSE, UA: NEGATIVE mg/dL
KETONES UR: 5 mg/dL — AB
LEUKOCYTES UA: NEGATIVE
Nitrite: NEGATIVE
PROTEIN: NEGATIVE mg/dL
Specific Gravity, Urine: 1.013 (ref 1.005–1.030)
pH: 7 (ref 5.0–8.0)

## 2017-06-25 LAB — BASIC METABOLIC PANEL
ANION GAP: 4 — AB (ref 5–15)
BUN: 11 mg/dL (ref 6–20)
CALCIUM: 8.8 mg/dL — AB (ref 8.9–10.3)
CO2: 26 mmol/L (ref 22–32)
CREATININE: 0.34 mg/dL — AB (ref 0.44–1.00)
Chloride: 108 mmol/L (ref 101–111)
GFR calc Af Amer: >60 mL/min (ref >60)
Glucose, Bld: 103 mg/dL — ABNORMAL HIGH (ref 65–99)
Potassium: 4 mmol/L (ref 3.5–5.1)
Sodium: 138 mmol/L (ref 135–145)

## 2017-06-25 MED ORDER — DEXAMETHASONE SODIUM PHOSPHATE 10 MG/ML IJ SOLN
10.0000 mg | Freq: Once | INTRAMUSCULAR | Status: AC
  Administered 2017-06-25: 10 mg via INTRAVENOUS
  Filled 2017-06-25: qty 1

## 2017-06-25 MED ORDER — FERROUS SULFATE 325 (65 FE) MG PO TABS: 325.0000 mg | ORAL_TABLET | Freq: Two times a day (BID) | ORAL | 0 refills | Status: DC

## 2017-06-25 MED ORDER — CYCLOBENZAPRINE HCL 5 MG PO TABS: 5.0000 mg | ORAL_TABLET | Freq: Three times a day (TID) | ORAL | 0 refills | Status: DC | PRN

## 2017-06-25 MED ORDER — SODIUM CHLORIDE 0.9 % IV SOLN
INTRAVENOUS | Status: DC
Start: 2017-06-25 — End: 2017-06-25
  Administered 2017-06-25: 01:00:00 via INTRAVENOUS

## 2017-06-25 MED ORDER — DIPHENHYDRAMINE HCL 50 MG/ML IJ SOLN
25.0000 mg | Freq: Once | INTRAMUSCULAR | Status: AC
  Administered 2017-06-25: 25 mg via INTRAVENOUS
  Filled 2017-06-25: qty 1

## 2017-06-25 MED ORDER — METOCLOPRAMIDE HCL 5 MG/ML IJ SOLN
10.0000 mg | Freq: Once | INTRAMUSCULAR | Status: AC
  Administered 2017-06-25: 10 mg via INTRAVENOUS
  Filled 2017-06-25: qty 2

## 2017-06-25 NOTE — Discharge Instructions (Signed)
Migraine Headache A migraine headache is an intense, throbbing pain on one side or both sides of the head. Migraines may also cause other symptoms, such as nausea, vomiting, and sensitivity to light and noise. What are the causes? Doing or taking certain things may also trigger migraines, such as:  Alcohol.  Smoking.  Medicines, such as: ? Medicine used to treat chest pain (nitroglycerine). ? Birth control pills. ? Estrogen pills. ? Certain blood pressure medicines.  Aged cheeses, chocolate, or caffeine.  Foods or drinks that contain nitrates, glutamate, aspartame, or tyramine.  Physical activity.  Other things that may trigger a migraine include:  Menstruation.  Pregnancy.  Hunger.  Stress, lack of sleep, too much sleep, or fatigue.  Weather changes.  What increases the risk? The following factors may make you more likely to experience migraine headaches:  Age. Risk increases with age.  Family history of migraine headaches.  Being Caucasian.  Depression and anxiety.  Obesity.  Being a woman.  Having a hole in the heart (patent foramen ovale) or other heart problems.  What are the signs or symptoms? The main symptom of this condition is pulsating or throbbing pain. Pain may:  Happen in any area of the head, such as on one side or both sides.  Interfere with daily activities.  Get worse with physical activity.  Get worse with exposure to bright lights or loud noises.  Other symptoms may include:  Nausea.  Vomiting.  Dizziness.  General sensitivity to bright lights, loud noises, or smells.  Before you get a migraine, you may get warning signs that a migraine is developing (aura). An aura may include:  Seeing flashing lights or having blind spots.  Seeing bright spots, halos, or zigzag lines.  Having tunnel vision or blurred vision.  Having numbness or a tingling feeling.  Having trouble talking.  Having muscle weakness.  How is this  diagnosed? A migraine headache can be diagnosed based on:  Your symptoms.  A physical exam.  Tests, such as CT scan or MRI of the head. These imaging tests can help rule out other causes of headaches.  Taking fluid from the spine (lumbar puncture) and analyzing it (cerebrospinal fluid analysis, or CSF analysis).  How is this treated? A migraine headache is usually treated with medicines that:  Relieve pain.  Relieve nausea.  Prevent migraines from coming back.  Treatment may also include:  Acupuncture.  Lifestyle changes like avoiding foods that trigger migraines.  Follow these instructions at home: Medicines  Take over-the-counter and prescription medicines only as told by your health care provider.  Do not drive or use heavy machinery while taking prescription pain medicine.  To prevent or treat constipation while you are taking prescription pain medicine, your health care provider may recommend that you: ? Drink enough fluid to keep your urine clear or pale yellow. ? Take over-the-counter or prescription medicines. ? Eat foods that are high in fiber, such as fresh fruits and vegetables, whole grains, and beans. ? Limit foods that are high in fat and processed sugars, such as fried and sweet foods. Lifestyle  Avoid alcohol use.  Do not use any products that contain nicotine or tobacco, such as cigarettes and e-cigarettes. If you need help quitting, ask your health care provider.  Get at least 8 hours of sleep every night.  Limit your stress. General instructions   Keep a journal to find out what may trigger your migraine headaches. For example, write down: ? What you eat and  drink. ? How much sleep you get. ? Any change to your diet or medicines.  If you have a migraine: ? Avoid things that make your symptoms worse, such as bright lights. ? It may help to lie down in a dark, quiet room. ? Do not drive or use heavy machinery. ? Ask your health care provider  what activities are safe for you while you are experiencing symptoms.  Keep all follow-up visits as told by your health care provider. This is important. Contact a health care provider if:  You develop symptoms that are different or more severe than your usual migraine symptoms. Get help right away if:  Your migraine becomes severe.  You have a fever.  You have a stiff neck.  You have vision loss.  Your muscles feel weak or like you cannot control them.  You start to lose your balance often.  You develop trouble walking.  You faint. This information is not intended to replace advice given to you by your health care provider. Make sure you discuss any questions you have with your health care provider. Document Released: 11/29/2005 Document Revised: 06/18/2016 Document Reviewed: 05/17/2016 Elsevier Interactive Patient Education  2017 [employer] Anemia, Nonspecific Anemia is a condition in which the concentration of red blood cells or hemoglobin in the blood is below normal. Hemoglobin is a substance in red blood cells that carries oxygen to the tissues of the body. Anemia results in not enough oxygen reaching these tissues. What are the causes? Common causes of anemia include:  Excessive bleeding. Bleeding may be internal or external. This includes excessive bleeding from periods (in women) or from the intestine.  Poor nutrition.  Chronic kidney, thyroid, and liver disease.  Bone marrow disorders that decrease red blood cell production.  Cancer and treatments for cancer.  HIV, AIDS, and their treatments.  Spleen problems that increase red blood cell destruction.  Blood disorders.  Excess destruction of red blood cells due to infection, medicines, and autoimmune disorders.  What are the signs or symptoms?  Minor weakness.  Dizziness.  Headache.  Palpitations.  Shortness of breath, especially with exercise.  Paleness.  Cold  sensitivity.  Indigestion.  Nausea.  Difficulty sleeping.  Difficulty concentrating. Symptoms may occur suddenly or they may develop slowly. How is this diagnosed? Additional blood tests are often needed. These help your health care provider determine the best treatment. Your health care provider will check your stool for blood and look for other causes of blood loss. How is this treated? Treatment varies depending on the cause of the anemia. Treatment can include:  Supplements of iron, vitamin C78, or folic acid.  Hormone medicines.  A blood transfusion. This may be needed if blood loss is severe.  Hospitalization. This may be needed if there is significant continual blood loss.  Dietary changes.  Spleen removal.  Follow these instructions at home: Keep all follow-up appointments. It often takes many weeks to correct anemia, and having your health care provider check on your condition and your response to treatment is very important. Get help right away if:  You develop extreme weakness, shortness of breath, or chest pain.  You become dizzy or have trouble concentrating.  You develop heavy vaginal bleeding.  You develop a rash.  You have bloody or black, tarry stools.  You faint.  You vomit up blood.  You vomit repeatedly.  You have abdominal pain.  You have a fever or persistent symptoms for more than 2-3 days.  You have  a fever and your symptoms suddenly get worse.  You are dehydrated. This information is not intended to replace advice given to you by your health care provider. Make sure you discuss any questions you have with your health care provider. Document Released: 01/06/2005 Document Revised: 05/12/2016 Document Reviewed: 05/25/2013 Elsevier Interactive Patient Education  2017 [employer].

## 2017-06-25 NOTE — MAU Provider Note (Signed)
History     CSN: [id number]  Arrival date and time: 06/24/17 2323  First Provider Initiated Contact with Patient 06/25/17 0026      Chief Complaint  Patient presents with  . Headache  . Post-op Problem   Cassandra Ellis is a 36 y.o. Non pregnant female who presents for headache & post op wound complaint. Patient is 4 days s/p LAVH & bilateral salpingectomy by Dr. Ihor Dow. Reports headache since Wednesday evening (see below note).  Also noted that the glue came off of her bilateral lower abdominal incisions. No bleeding or drainage from incisions, just concerned that they may have opened up. Denies abdominal pain or vaginal bleeding.    Headache   This is a new problem. Episode onset: Wednesday evening. The problem occurs constantly. The problem has been gradually worsening. The pain is located in the bilateral, frontal and retro-orbital (worse on right side but is bilateral) region. The pain radiates to the right neck. The pain quality is not similar to prior headaches. The quality of the pain is described as aching and throbbing. The pain is at a severity of 10/10. Associated symptoms include eye watering, nausea, photophobia and rhinorrhea. Pertinent negatives include no abdominal pain, blurred vision, dizziness, seizures, sinus pressure, tinnitus, visual change or vomiting. The symptoms are aggravated by bright light. She has tried NSAIDs and oral narcotics for the symptoms. The treatment provided no relief. There is no history of hypertension, migraine headaches or recent head traumas.   Past Medical History:  Diagnosis Date  . GERD (gastroesophageal reflux disease)   . Hyperthyroidism   . Thyroid disease     Past Surgical History:  Procedure Laterality Date  . CESAREAN SECTION  2001  . CYSTOSCOPY  06/21/2017   Procedure: CYSTOSCOPY;  Surgeon: Lavonia Drafts, MD;  Location: [facility];  Service: Gynecology;;  . LAPAROSCOPIC VAGINAL HYSTERECTOMY WITH SALPINGECTOMY Bilateral  06/21/2017   Procedure: LAPAROSCOPIC ASSISTED VAGINAL HYSTERECTOMY WITH BILATERAL SALPINGECTOMY;  Surgeon: Lavonia Drafts, MD;  Location: [facility];  Service: Gynecology;  Laterality: Bilateral;  bilateral saplingectomy   . TUBAL LIGATION  01/27/2001  . WISDOM TOOTH EXTRACTION      No family history on file.  Social History  Substance Use Topics  . Smoking status: Never Smoker  . Smokeless tobacco: Never Used  . Alcohol use No    Allergies:  Allergies  Allergen Reactions  . Milk-Related Compounds Other (See Comments)    Abd cramping and gas  . Watermelon Flavor Hives    Prescriptions Prior to Admission  Medication Sig Dispense Refill Last Dose  . albuterol (PROVENTIL HFA;VENTOLIN HFA) 108 (90 Base) MCG/ACT inhaler Inhale 2 puffs into the lungs every 6 (six) hours as needed for wheezing or shortness of breath.   Past Month at Unknown time  . cholecalciferol (VITAMIN D) 1000 units tablet Take 5,000 Units by mouth daily.   More than a month at Unknown time  . docusate sodium (COLACE) 100 MG capsule Take 1 capsule (100 mg total) by mouth 2 (two) times daily. 10 capsule 0   . ibuprofen (ADVIL,MOTRIN) 600 MG tablet Take 1 tablet (600 mg total) by mouth every 6 (six) hours as needed (mild pain). 30 tablet 0   . omeprazole (PRILOSEC) 20 MG capsule Take 1 capsule (20 mg total) by mouth daily. 30 capsule 0 Past Month at Unknown time  . oxyCODONE-acetaminophen (PERCOCET/ROXICET) 5-325 MG tablet Take 1-2 tablets by mouth every 6 (six) hours as needed. 30 tablet 0   . propylthiouracil (PTU)  50 MG tablet Take 50 mg by mouth 3 (three) times daily.   More than a month at Unknown time    Review of Systems  Constitutional: Negative.   HENT: Positive for rhinorrhea. Negative for sinus pressure and tinnitus.   Eyes: Positive for photophobia and discharge. Negative for blurred vision.  Respiratory: Negative for shortness of breath.   Cardiovascular: Negative for chest pain.  Gastrointestinal:  Positive for nausea. Negative for abdominal pain and vomiting.  Genitourinary: Negative.   Skin: Positive for wound.  Neurological: Positive for headaches. Negative for dizziness, seizures and syncope.   Physical Exam   Blood pressure 138/77, pulse 95, temperature 98.3 F (36.8 C), temperature source Oral, resp. rate 17, last menstrual period 06/03/2017, SpO2 99 %.  Physical Exam  Nursing note and vitals reviewed. Constitutional: She is oriented to person, place, and time. She appears well-developed and well-nourished. She appears distressed.  HENT:  Head: Normocephalic and atraumatic.  Eyes: Conjunctivae are normal. Right eye exhibits no discharge. Left eye exhibits no discharge. No scleral icterus.  Neck: Normal range of motion.  Cardiovascular: Normal rate, regular rhythm and normal heart sounds.   No murmur heard. Respiratory: Effort normal and breath sounds normal. No respiratory distress. She has no wheezes.  GI: Soft. Bowel sounds are normal. There is no tenderness.  Neurological: She is alert and oriented to person, place, and time.  Skin: Skin is warm and dry. She is not diaphoretic.  BLQ incisions that are ~1 cm without adhesive or bandage; well healing; no drainage, erythema, or other signs of infection.   Psychiatric: She has a normal mood and affect. Her behavior is normal. Judgment and thought content normal.    MAU Course  Procedures Results for orders placed or performed during the hospital encounter of 06/24/17 (from the past 24 hour(s))  Urinalysis, Routine w reflex microscopic     Status: Abnormal   Collection Time: 06/24/17 11:45 PM  Result Value Ref Range   Color, Urine YELLOW YELLOW   APPearance CLEAR CLEAR   Specific Gravity, Urine 1.013 1.005 - 1.030   pH 7.0 5.0 - 8.0   Glucose, UA NEGATIVE NEGATIVE mg/dL   Hgb urine dipstick SMALL (A) NEGATIVE   Bilirubin Urine NEGATIVE NEGATIVE   Ketones, ur 5 (A) NEGATIVE mg/dL   Protein, ur NEGATIVE NEGATIVE mg/dL    Nitrite NEGATIVE NEGATIVE   Leukocytes, UA NEGATIVE NEGATIVE   RBC / HPF 0-5 0 - 5 RBC/hpf   WBC, UA 0-5 0 - 5 WBC/hpf   Bacteria, UA NONE SEEN NONE SEEN   Squamous Epithelial / LPF 0-5 (A) NONE SEEN  CBC     Status: Abnormal   Collection Time: 06/25/17 12:50 AM  Result Value Ref Range   WBC 7.3 4.0 - 10.5 K/uL   RBC 3.60 (L) 3.87 - 5.11 MIL/uL   Hemoglobin 8.8 (L) 12.0 - 15.0 g/dL   HCT 26.3 (L) 36.0 - 46.0 %   MCV 73.1 (L) 78.0 - 100.0 fL   MCH 24.4 (L) 26.0 - 34.0 pg   MCHC 33.5 30.0 - 36.0 g/dL   RDW 14.7 11.5 - 15.5 %   Platelets 219 150 - 400 K/uL  Basic metabolic panel     Status: Abnormal   Collection Time: 06/25/17 12:58 AM  Result Value Ref Range   Sodium 138 135 - 145 mmol/L   Potassium 4.0 3.5 - 5.1 mmol/L   Chloride 108 101 - 111 mmol/L   CO2 26 22 - 32 mmol/L  Glucose, Bld 103 (H) 65 - 99 mg/dL   BUN 11 6 - 20 mg/dL   Creatinine, Ser 0.34 (L) 0.44 - 1.00 mg/dL   Calcium 8.8 (L) 8.9 - 10.3 mg/dL   GFR calc non Af Amer >60 >60 mL/min   GFR calc Af Amer >60 >60 mL/min   Anion gap 4 (L) 5 - 15    MDM Headache cocktail (decadron, reglan, benadryl) Pain improved from 12/10 > 3/10.   Assessment and Plan  A: 1. Migraine without aura and without status migrainosus, not intractable   2. Wound disruption, post-op, skin, initial encounter   3. Anemia, unspecified type    P; Discharge home Rx flexeril & iron supplements Patient has surgical f/u with Dr. Eugenie Norrie in 2 weeks Discussed reasons to return to MAU vs ED  Jorje Guild 06/25/2017, 12:26 AM

## 2017-06-28 ENCOUNTER — Encounter (HOSPITAL_COMMUNITY): Payer: Self-pay

## 2017-06-30 ENCOUNTER — Encounter (HOSPITAL_COMMUNITY): Payer: Self-pay

## 2017-07-06 ENCOUNTER — Encounter: Payer: Self-pay | Admitting: Obstetrics & Gynecology

## 2017-07-06 ENCOUNTER — Ambulatory Visit (INDEPENDENT_AMBULATORY_CARE_PROVIDER_SITE_OTHER): Payer: Self-pay | Admitting: Obstetrics & Gynecology

## 2017-07-06 VITALS — BP 139/78 | HR 92 | Wt 166.0 lb

## 2017-07-06 DIAGNOSIS — Z9889 Other specified postprocedural states: Secondary | ICD-10-CM

## 2017-07-06 NOTE — Progress Notes (Signed)
History:  36 y.o. here today for 2 week post op check after LAVH with bilateral salpingectomy.   Pt reports that she is not having an y pain and is bored at home. Feels ready to go back to work. She reports no bleeding since 1 week after surgery. She is voiding and passing stools without difficulty.    The following portions of the patient's history were reviewed and updated as appropriate: allergies, current medications, past family history, past medical history, past social history, past surgical history and problem list.  Review of Systems:  Pertinent items are noted in HPI.   Objective:  Physical Exam Blood pressure 139/78, pulse 92, weight 166 lb (75.3 kg). CONSTITUTIONAL: Well-developed, well-nourished female in no acute distress.  HENT:  Normocephalic, atraumatic EYES: Conjunctivae and EOM are normal. No scleral icterus.  NECK: Normal range of motion SKIN: Skin is warm and dry. No rash noted. Not diaphoretic.No pallor. [place]: Alert and oriented to person, place, and time. Normal coordination.  Abd: Soft, nontender and nondistended; port sites well healed.  Pelvic:  deferred  Labs and Imaging 06/21/2017 Diagnosis Uterus, cervix and bilateral fallopian tubes - UTERUS: -ENDOMETRIUM: BENIGN ENDOMETRIAL TYPE POLYP. PROLIFERATIVE ENDOMETRIUM. NO HYPERPLASIA OR MALIGNANCY. 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 -SEROSA: UNREMARKABLE. NO MALIGNANCY. - CERVIX: BENIGN SQUAMOUS AND ENDOCERVICAL MUCOSA. NO DYSPLASIA OR MALIGNANCY. - BILATERAL FALLOPIAN TUBES: UNREMARKABLE. NO MALIGNANCY  Assessment & Plan:  2 week post check- doing well  F/u in 4 weeks or sooner prn  Gradual increase in activities.  NO intercourse.  May RTW in 5 days  Christorpher Hisaw L. Harraway-Smith, M.D., Cherlynn June

## 2017-07-06 NOTE — Patient Instructions (Signed)
Laparoscopically Assisted Vaginal Hysterectomy, Care After °Refer to this sheet in the next few weeks. These instructions provide you with information on caring for yourself after your procedure. Your health care provider may also give you more specific instructions. Your treatment has been planned according to current medical practices, but problems sometimes occur. Call your health care provider if you have any problems or questions after your procedure. °What can I expect after the procedure? °After your procedure, it is typical to have the following: °· Abdominal pain. You will be given pain medicine to control it. °· Sore throat from the breathing tube that was inserted during surgery. ° °Follow these instructions at home: °· Only take over-the-counter or prescription medicines for pain, discomfort, or fever as directed by your health care provider. °· Do not take aspirin. It can cause bleeding. °· Do not drive when taking pain medicine. °· Follow your health care provider's advice regarding diet, exercise, lifting, driving, and general activities. °· Resume your usual diet as directed and allowed. °· Get plenty of rest and sleep. °· Do not douche, use tampons, or have sexual intercourse for at least 6 weeks, or until your health care provider gives you permission. °· Change your bandages (dressings) as directed by your health care provider. °· Monitor your temperature and notify your health care provider of a fever. °· Take showers instead of baths for 2-3 weeks. °· Do not drink alcohol until your health care provider gives you permission. °· If you develop constipation, you may take a mild laxative with your health care provider's permission. Bran foods may help with constipation problems. Drinking enough fluids to keep your urine clear or pale yellow may help as well. °· Try to have someone home with you for 1-2 weeks to help around the house. °· Keep all of your follow-up appointments as directed by your  health care provider. °Contact a health care provider if: °· You have swelling, redness, or increasing pain around your incision sites. °· You have pus coming from your incision. °· You notice a bad smell coming from your incision. °· Your incision breaks open. °· You feel dizzy or lightheaded. °· You have pain or bleeding when you urinate. °· You have persistent diarrhea. °· You have persistent nausea and vomiting. °· You have abnormal vaginal discharge. °· You have a rash. °· You have any type of abnormal reaction or develop an allergy to your medicine. °· You have poor pain control with your prescribed medicine. °Get help right away if: °· You have a fever. °· You have severe abdominal pain. °· You have chest pain. °· You have shortness of breath. °· You faint. °· You have pain, swelling, or redness in your leg. °· You have heavy vaginal bleeding with blood clots. °This information is not intended to replace advice given to you by your health care provider. Make sure you discuss any questions you have with your health care provider. °Document Released: 11/18/2011 Document Revised: 05/06/2016 Document Reviewed: 06/14/2013 °Elsevier Interactive Patient Education © 2017 [employer] ° °

## 2017-07-28 ENCOUNTER — Ambulatory Visit: Payer: Medicaid Other | Admitting: Obstetrics & Gynecology

## 2017-08-08 ENCOUNTER — Ambulatory Visit (INDEPENDENT_AMBULATORY_CARE_PROVIDER_SITE_OTHER): Payer: Self-pay | Admitting: Obstetrics & Gynecology

## 2017-08-08 ENCOUNTER — Encounter: Payer: Self-pay | Admitting: Obstetrics & Gynecology

## 2017-08-08 VITALS — BP 135/82 | HR 91 | Wt 168.0 lb

## 2017-08-08 DIAGNOSIS — Z9889 Other specified postprocedural states: Secondary | ICD-10-CM

## 2017-08-08 NOTE — Progress Notes (Signed)
History:  36 y.o. No obstetric history on file. here today for her 6 week p[ost check. Pt is s/p LAVH with bilateral salpingectomy on 06/21/2017. Pt reports no problems.  She reports no pain. She is voiding and passing stools without difficulty. She did initially have hot flushes which have resolved.    The following portions of the patient's history were reviewed and updated as appropriate: allergies, current medications, past family history, past medical history, past social history, past surgical history and problem list.  Review of Systems:  Pertinent items are noted in HPI.   Objective:  Physical Exam BP 135/82   Pulse 91   Wt 168 lb (76.2 kg)   BMI 31.74 kg/m   CONSTITUTIONAL: Well-developed, well-nourished female in no acute distress.  HENT:  Normocephalic, atraumatic EYES: Conjunctivae and EOM are normal. No scleral icterus.  NECK: Normal range of motion SKIN: Skin is warm and dry. No rash noted. Not diaphoretic.No pallor. Bailey: Alert and oriented to person, place, and time. Normal coordination.  Abd: Soft, nontender and nondistended; port sites well healed Pelvic: Normal appearing external genitalia; normal appearing vaginal mucosa and vaginal cuff.  Normal discharge. No  palpable masses or adnexal tenderness   Assessment & Plan:  6 week post op check. Doing well. Return to full activities F/u in 3 months. If no problems may f/u in 1 year or sooner prn  Myliyah Rebuck L. Harraway-Smith, M.D., Cherlynn June

## 2018-04-13 ENCOUNTER — Emergency Department (HOSPITAL_BASED_OUTPATIENT_CLINIC_OR_DEPARTMENT_OTHER)
Admission: EM | Admit: 2018-04-13 | Discharge: 2018-04-13 | Disposition: A | Discharge status: Home / Self Care | Payer: Self-pay | Attending: Emergency Medicine | Admitting: Emergency Medicine

## 2018-04-13 ENCOUNTER — Encounter (HOSPITAL_BASED_OUTPATIENT_CLINIC_OR_DEPARTMENT_OTHER): Payer: Self-pay

## 2018-04-13 DIAGNOSIS — E079 Disorder of thyroid, unspecified: Secondary | ICD-10-CM | POA: Insufficient documentation

## 2018-04-13 DIAGNOSIS — F1210 Cannabis abuse, uncomplicated: Secondary | ICD-10-CM | POA: Insufficient documentation

## 2018-04-13 DIAGNOSIS — Z79899 Other long term (current) drug therapy: Secondary | ICD-10-CM | POA: Insufficient documentation

## 2018-04-13 DIAGNOSIS — R197 Diarrhea, unspecified: Secondary | ICD-10-CM

## 2018-04-13 DIAGNOSIS — E860 Dehydration: Secondary | ICD-10-CM | POA: Insufficient documentation

## 2018-04-13 LAB — CBC
HEMATOCRIT: 34.7 % — AB (ref 36.0–46.0)
HEMOGLOBIN: 12.1 g/dL (ref 12.0–15.0)
MCH: 24.9 pg — AB (ref 26.0–34.0)
MCHC: 34.9 g/dL (ref 30.0–36.0)
MCV: 71.4 fL — AB (ref 78.0–100.0)
Platelets: 232 10*3/uL (ref 150–400)
RBC: 4.86 MIL/uL (ref 3.87–5.11)
RDW: 14.2 % (ref 11.5–15.5)
WBC: 4.5 10*3/uL (ref 4.0–10.5)

## 2018-04-13 LAB — BASIC METABOLIC PANEL
Anion gap: 7 (ref 5–15)
BUN: 13 mg/dL (ref 6–20)
CHLORIDE: 112 mmol/L — AB (ref 101–111)
CO2: 21 mmol/L — AB (ref 22–32)
Calcium: 8.7 mg/dL — ABNORMAL LOW (ref 8.9–10.3)
Creatinine, Ser: <0.3 mg/dL — ABNORMAL LOW (ref 0.44–1.00)
GLUCOSE: 104 mg/dL — AB (ref 65–99)
POTASSIUM: 3.6 mmol/L (ref 3.5–5.1)
SODIUM: 140 mmol/L (ref 135–145)

## 2018-04-13 MED ORDER — SODIUM CHLORIDE 0.9 % IV BOLUS
1000.0000 mL | Freq: Once | INTRAVENOUS | Status: AC
  Administered 2018-04-13: 1000 mL via INTRAVENOUS

## 2018-04-13 NOTE — ED Provider Notes (Signed)
[facility]PART[facility] Provider Note   CSN: [id number] Arrival date & time: 04/13/18  9476     History   Chief Complaint Chief Complaint  Patient presents with  . Diarrhea    HPI Cassandra Ellis is a 37 y.o. female.  HPI 37 year old female presents the emergency department 6 days of watery stool.  Reports crampy abdominal pain just prior to a bowel movement.  Denies focal abdominal pain.  No fevers or chills.  No recent travel outside the country.  No recent antibiotics.  Denies nausea and vomiting.  No fevers.  Symptoms are mild in severity.  No other complaints.  No known recent sick contacts   Past Medical History:  Diagnosis Date  . GERD (gastroesophageal reflux disease)   . Hyperthyroidism   . Thyroid disease     Patient Active Problem List   Diagnosis Date Noted  . Pelvic pain in female 06/21/2017  . Abnormal uterine bleeding (AUB) 06/21/2017  . Fibroids, submucosal 06/21/2017  . Endometrial polyp 06/21/2017  . Post-operative state 06/21/2017  . Generalized headache 03/28/2013  . Nausea and vomiting 03/28/2013  . Hypokalemia 03/28/2013  . Intractable headache 03/28/2013    Past Surgical History:  Procedure Laterality Date  . CESAREAN SECTION  2001  . CYSTOSCOPY  06/21/2017   Procedure: CYSTOSCOPY;  Surgeon: Lavonia Drafts, MD;  Location: [facility];  Service: Gynecology;;  . LAPAROSCOPIC VAGINAL HYSTERECTOMY WITH SALPINGECTOMY Bilateral 06/21/2017   Procedure: LAPAROSCOPIC ASSISTED VAGINAL HYSTERECTOMY WITH BILATERAL SALPINGECTOMY;  Surgeon: Lavonia Drafts, MD;  Location: [facility];  Service: Gynecology;  Laterality: Bilateral;  bilateral saplingectomy   . TUBAL LIGATION  01/27/2001  . WISDOM TOOTH EXTRACTION       OB History   None      Home Medications    Prior to Admission medications   Medication Sig Start Date End Date Taking? Authorizing Provider  albuterol (PROVENTIL HFA;VENTOLIN HFA) 108 (90 Base) MCG/ACT inhaler  Inhale 2 puffs into the lungs every 6 (six) hours as needed for wheezing or shortness of breath.    [provider]  cholecalciferol (VITAMIN D) 1000 units tablet Take 5,000 Units by mouth daily.    [provider]  cyclobenzaprine (FLEXERIL) 5 MG tablet Take 1 tablet (5 mg total) by mouth 3 (three) times daily as needed for muscle spasms. 06/25/17   Jorje Guild, NP  docusate sodium (COLACE) 100 MG capsule Take 1 capsule (100 mg total) by mouth 2 (two) times daily. 06/22/17   Lavonia Drafts, MD  ferrous sulfate (FERROUSUL) 325 (65 FE) MG tablet Take 1 tablet (325 mg total) by mouth 2 (two) times daily with a meal. 06/25/17   Jorje Guild, NP  ibuprofen (ADVIL,MOTRIN) 600 MG tablet Take 1 tablet (600 mg total) by mouth every 6 (six) hours as needed (mild pain). 06/22/17   Lavonia Drafts, MD  omeprazole (PRILOSEC) 20 MG capsule Take 1 capsule (20 mg total) by mouth daily. 12/27/15   Leo Grosser, MD  oxyCODONE-acetaminophen (PERCOCET/ROXICET) 5-325 MG tablet Take 1-2 tablets by mouth every 6 (six) hours as needed. 06/22/17   Lavonia Drafts, MD  propylthiouracil (PTU) 50 MG tablet Take 50 mg by mouth 3 (three) times daily.    [provider]    Family History No family history on file.  Social History Social History   Tobacco Use  . Smoking status: Never Smoker  . Smokeless tobacco: Never Used  Substance Use Topics  . Alcohol use: No  . Drug use: Yes  Types: Marijuana    Comment: last use 06/19/17     Allergies   Milk-related compounds and Watermelon flavor   Review of Systems Review of Systems  All other systems reviewed and are negative.    Physical Exam Updated Vital Signs BP (!) 149/85 (BP Location: Right Arm)   Pulse 90   Temp 97.8 F (36.6 C) (Oral)   Resp 18   LMP 06/03/2017 (Approximate)   SpO2 100%   Physical Exam  Constitutional: She is oriented to person, place, and time. She appears well-developed and  well-nourished.  HENT:  Head: Normocephalic.  Eyes: EOM are normal.  Neck: Normal range of motion.  Cardiovascular: Normal rate and regular rhythm.  Pulmonary/Chest: Effort normal.  Abdominal: Soft. She exhibits no distension. There is no tenderness. There is no guarding.  Musculoskeletal: Normal range of motion.  Neurological: She is alert and oriented to person, place, and time.  Psychiatric: She has a normal mood and affect.  Nursing note and vitals reviewed.    ED Treatments / Results  Labs (all labs ordered are listed, but only abnormal results are displayed) Labs Reviewed  CBC - Abnormal; Notable for the following components:      Result Value   HCT 34.7 (*)    MCV 71.4 (*)    MCH 24.9 (*)    All other components within normal limits  BASIC METABOLIC PANEL - Abnormal; Notable for the following components:   Chloride 112 (*)    CO2 21 (*)    Glucose, Bld 104 (*)    Creatinine, Ser <0.30 (*)    Calcium 8.7 (*)    All other components within normal limits    EKG None  Radiology No results found.  Procedures Procedures (including critical care time)  Medications Ordered in ED Medications  sodium chloride 0.9 % bolus 1,000 mL (1,000 mLs Intravenous New Bag/Given 04/13/18 0809)     Initial Impression / Assessment and Plan / ED Course  I have reviewed the triage vital signs and the nursing notes.  Pertinent labs & imaging results that were available during my care of the patient were reviewed by me and considered in my medical decision making (see chart for details).     Suspect a viral process.  Abdominal exam without focal tenderness.  IV fluids now.  Will check electrolytes.  8:57 AM Hydrated in the emergency department.  Labs without significant abnormality.  Potassium is 3.6.  Discharged home in good condition.  Primary care follow-up.  Ongoing oral hydration recommended at home.  Patient understands return to the ER for new or worsening symptoms  Final  Clinical Impressions(s) / ED Diagnoses   Final diagnoses:  Diarrhea, unspecified type  Dehydration    ED Discharge Orders    None       Jola Schmidt, MD 04/13/18 [phone]

## 2018-04-13 NOTE — ED Triage Notes (Signed)
Pt c/o diarrhea with abdominal cramping since Friday. States took imodium on Saturday and had a normal stool then diarrhea after it wore off

## 2019-02-10 ENCOUNTER — Other Ambulatory Visit: Payer: Self-pay

## 2019-02-10 ENCOUNTER — Emergency Department (HOSPITAL_BASED_OUTPATIENT_CLINIC_OR_DEPARTMENT_OTHER): Payer: Self-pay

## 2019-02-10 ENCOUNTER — Encounter (HOSPITAL_BASED_OUTPATIENT_CLINIC_OR_DEPARTMENT_OTHER): Payer: Self-pay | Admitting: *Deleted

## 2019-02-10 ENCOUNTER — Emergency Department (HOSPITAL_BASED_OUTPATIENT_CLINIC_OR_DEPARTMENT_OTHER)
Admission: EM | Admit: 2019-02-10 | Discharge: 2019-02-10 | Disposition: A | Discharge status: Home / Self Care | Payer: Self-pay | Attending: Emergency Medicine | Admitting: Emergency Medicine

## 2019-02-10 ENCOUNTER — Telehealth (HOSPITAL_BASED_OUTPATIENT_CLINIC_OR_DEPARTMENT_OTHER): Payer: Self-pay | Admitting: Emergency Medicine

## 2019-02-10 DIAGNOSIS — Y999 Unspecified external cause status: Secondary | ICD-10-CM | POA: Insufficient documentation

## 2019-02-10 DIAGNOSIS — L089 Local infection of the skin and subcutaneous tissue, unspecified: Secondary | ICD-10-CM | POA: Insufficient documentation

## 2019-02-10 DIAGNOSIS — W208XXA Other cause of strike by thrown, projected or falling object, initial encounter: Secondary | ICD-10-CM | POA: Insufficient documentation

## 2019-02-10 DIAGNOSIS — Y9389 Activity, other specified: Secondary | ICD-10-CM | POA: Insufficient documentation

## 2019-02-10 DIAGNOSIS — Y929 Unspecified place or not applicable: Secondary | ICD-10-CM | POA: Insufficient documentation

## 2019-02-10 DIAGNOSIS — S60512A Abrasion of left hand, initial encounter: Secondary | ICD-10-CM | POA: Insufficient documentation

## 2019-02-10 MED ORDER — FLUCONAZOLE 150 MG PO TABS: ORAL_TABLET | ORAL | 0 refills | Status: AC

## 2019-02-10 MED ORDER — CEPHALEXIN 250 MG PO CAPS
1000.0000 mg | ORAL_CAPSULE | Freq: Once | ORAL | Status: AC
  Administered 2019-02-10: 1000 mg via ORAL
  Filled 2019-02-10: qty 4

## 2019-02-10 MED ORDER — HYDROCODONE-ACETAMINOPHEN 5-325 MG PO TABS: 2.0000 | ORAL_TABLET | ORAL | 0 refills | Status: AC | PRN

## 2019-02-10 MED ORDER — CEPHALEXIN 500 MG PO CAPS: 500.0000 mg | ORAL_CAPSULE | Freq: Four times a day (QID) | ORAL | 0 refills | Status: AC

## 2019-02-10 MED ORDER — CEPHALEXIN 500 MG PO CAPS: 500.0000 mg | ORAL_CAPSULE | Freq: Four times a day (QID) | ORAL | 0 refills | Status: DC

## 2019-02-10 MED ORDER — HYDROCODONE-ACETAMINOPHEN 5-325 MG PO TABS: 2.0000 | ORAL_TABLET | ORAL | 0 refills | Status: DC | PRN

## 2019-02-10 MED ORDER — HYDROCODONE-ACETAMINOPHEN 5-325 MG PO TABS
1.0000 | ORAL_TABLET | Freq: Once | ORAL | Status: AC
  Administered 2019-02-10: 1 via ORAL
  Filled 2019-02-10: qty 1

## 2019-02-10 NOTE — ED Notes (Signed)
Patient educated about not driving or performing other critical tasks (such as operating heavy machinery, caring for infant/toddler/child) due to sedative nature of narcotic medications received while in the ED.  Pt/caregiver verbalized understanding.   

## 2019-02-10 NOTE — ED Provider Notes (Addendum)
[facility] Provider Note: Georgena Spurling, MD, FACEP  CSN: [id number] MRN: [id number] ARRIVAL: 02/10/19 at 0537 ROOM: [place] [facility]   HISTORY OF PRESENT ILLNESS  02/10/19 5:50 AM Cassandra Ellis is a 38 y.o. female who was moving a metal bed frame the evening before last and the frame fell on her left hand.  She has an abrasion (but states not a deep laceration) overlying the left third metacarpophalangeal joint.  Since the injury she has developed erythema, pain and swelling around that abrasion.  She rates her pain as a 10 out of 10, worse with palpation or movement.  The onset was gradual.  She took some ibuprofen about 10 PM with out adequate relief.   Past Medical History:  Diagnosis Date  . GERD (gastroesophageal reflux disease)   . Hyperthyroidism   . Thyroid disease     Past Surgical History:  Procedure Laterality Date  . CESAREAN SECTION  2001  . CYSTOSCOPY  06/21/2017   Procedure: CYSTOSCOPY;  Surgeon: Lavonia Drafts, MD;  Location: [facility];  Service: Gynecology;;  . LAPAROSCOPIC VAGINAL HYSTERECTOMY WITH SALPINGECTOMY Bilateral 06/21/2017   Procedure: LAPAROSCOPIC ASSISTED VAGINAL HYSTERECTOMY WITH BILATERAL SALPINGECTOMY;  Surgeon: Lavonia Drafts, MD;  Location: [facility];  Service: Gynecology;  Laterality: Bilateral;  bilateral saplingectomy   . TUBAL LIGATION  01/27/2001  . WISDOM TOOTH EXTRACTION      No family history on file.  Social History   Tobacco Use  . Smoking status: Never Smoker  . Smokeless tobacco: Never Used  Substance Use Topics  . Alcohol use: No  . Drug use: Yes    Types: Marijuana    Comment: last use 06/19/17    Prior to Admission medications   Medication Sig Start Date End Date Taking? Authorizing Provider  cephALEXin (KEFLEX) 500 MG capsule Take 1 capsule (500 mg total) by mouth 4 (four) times daily. 02/10/19   Manu Rubey, MD  fluconazole (DIFLUCAN) 150 MG tablet Take 1 tablet as  needed for vaginal yeast infection.  May repeat in 3 days if symptoms persist. 02/10/19   Enya Bureau, Jenny Reichmann, MD  HYDROcodone-acetaminophen (NORCO) 5-325 MG tablet Take 2 tablets by mouth every 4 (four) hours as needed (for pain). 02/10/19   Mikinzie Maciejewski, MD    Allergies Milk-related compounds and Watermelon flavor   REVIEW OF SYSTEMS  Negative except as noted here or in the History of Present Illness.   PHYSICAL EXAMINATION  Initial Vital Signs Blood pressure (!) 164/90, pulse (!) 118, temperature 98.1 F (36.7 C), temperature source Oral, resp. rate 18, last menstrual period 06/03/2017, SpO2 98 %.  Examination General: Well-developed, well-nourished female in no acute distress; appearance consistent with age of record HENT: normocephalic; atraumatic Eyes: Normal appearance Neck: supple Heart: regular rate and rhythm Lungs: clear to auscultation bilaterally Abdomen: soft; nondistended; nontender; bowel sounds present Extremities: No deformity; abrasion over left third metacarpophalangeal joint with surrounding erythema, swelling, tenderness and warmth:    Neurologic: Awake, alert and oriented; motor function intact in all extremities and symmetric; no facial droop Skin: Warm and dry Psychiatric: Normal mood and affect   RESULTS  Summary of this visit's results, reviewed by myself:   EKG Interpretation  Date/Time:    Ventricular Rate:    PR Interval:    QRS Duration:   QT Interval:    QTC Calculation:   R Axis:     Text Interpretation:        Laboratory Studies: No results  found for this or any previous visit (from the past 24 hour(s)). Imaging Studies: Dg Hand Complete Left  Result Date: 02/10/2019 CLINICAL DATA:  Initial evaluation for acute left hand injury, swelling and pain. EXAM: LEFT HAND - COMPLETE 3+ VIEW COMPARISON:  None. FINDINGS: No acute fracture or dislocation. 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 Osseous mineralization normal. No  appreciable soft tissue injury. IMPRESSION: No acute osseous abnormality about the left hand. Electronically Signed   By: Jeannine Boga M.D.   On: 02/10/2019 06:34    ED COURSE and MDM  Nursing notes and initial vitals signs, including pulse oximetry, reviewed.  Vitals:   02/10/19 0543 02/10/19 0546  BP: (!) 164/90   Pulse: (!) 118 (!) 111  Resp: 18   Temp: 98.1 F (36.7 C)   TempSrc: Oral   SpO2: 98% 98%   Examination is consistent with an infected abrasion of the left hand.  We will start on Keflex.  She was advised to return in 48 hours if symptoms are not improving or sooner if symptoms are worsening.  PROCEDURES    ED DIAGNOSES     ICD-10-CM   1. Abrasion of hand with infection, left, initial encounter X64.680H    L08.9        Deaire Mcwhirter, Jenny Reichmann, MD 02/10/19 2122    Shanon Rosser, MD 02/10/19 [phone]

## 2019-02-10 NOTE — ED Triage Notes (Signed)
Pt reports last night she was moving a metal bed and the bed fell on her left hand. She has pain and swelling to the left hand.

## 2019-08-31 ENCOUNTER — Ambulatory Visit (INDEPENDENT_AMBULATORY_CARE_PROVIDER_SITE_OTHER): Payer: Managed Care, Other (non HMO) | Admitting: Obstetrics & Gynecology

## 2019-08-31 ENCOUNTER — Other Ambulatory Visit: Payer: Self-pay

## 2019-08-31 ENCOUNTER — Encounter: Payer: Self-pay | Admitting: Obstetrics & Gynecology

## 2019-08-31 VITALS — BP 130/63 | HR 87 | Ht 61.0 in | Wt 159.1 lb

## 2019-08-31 DIAGNOSIS — N951 Menopausal and female climacteric states: Secondary | ICD-10-CM | POA: Diagnosis not present

## 2019-08-31 DIAGNOSIS — Z01419 Encounter for gynecological examination (general) (routine) without abnormal findings: Secondary | ICD-10-CM

## 2019-08-31 NOTE — Progress Notes (Signed)
Subjective:     Cassandra Ellis is a 38 y.o. female here for a routine exam. G2P2 LMP 2018  Current complaints: occ hot flushes . Not sure if its related to thyroid issues. Her primary care provider recently ordered thyroid studies. Pt is s/p LAVH 06/2017    Gynecologic History Patient's last menstrual period was 06/03/2017 (approximate). Contraception: status post hysterectomy Last Pap: 06/24/2009. Results were: normal Last mammogram: never had.   Obstetric History G2P2  The following portions of the patient's history were reviewed and updated as appropriate: allergies, current medications, past family history, past medical history, past social history, past surgical history and problem list.  Review of Systems Pertinent items are noted in HPI.    Objective:  BP 130/63   Pulse 87   Ht 5\' 1"  (1.549 m)   Wt 159 lb 1.3 oz (72.2 kg)   LMP 06/03/2017 (Approximate)   BMI 30.06 kg/m   General Appearance:    Alert, cooperative, no distress, appears stated age  Head:    Normocephalic, without obvious abnormality, atraumatic  Eyes:    conjunctiva/corneas clear, EOM's intact, both eyes  Ears:    Normal external ear canals, both ears  Nose:   Nares normal, septum midline, mucosa normal, no drainage    or sinus tenderness  Throat:   Lips, mucosa, and tongue normal; teeth and gums normal  Neck:   Supple, symmetrical, trachea midline, no adenopathy;    thyroid:  no enlargement/tenderness/nodules  Back:     Symmetric, no curvature, ROM normal, no CVA tenderness  Lungs:     respirations unlabored  Chest Wall:    No tenderness or deformity   Heart:    Regular rate and rhythm  Breast Exam:    No tenderness, masses, or nipple abnormality  Abdomen:     Soft, non-tender, bowel sounds active all four quadrants,    no masses, no organomegaly  Genitalia:    Normal female without lesion, discharge or tenderness     Extremities:   Extremities normal, atraumatic, no cyanosis or edema  Pulses:   2+ and  symmetric all extremities  Skin:   Skin color, texture, turgor normal, no rashes or lesions    Assessment:    Healthy female exam.   Hot flushes post hyst. (oviaries not removed) breast cancer screen Thyroid screen - results from [facility] reviewed and elevated.   Plan:    Mammogram ordered. Follow up in: 1 year.    rec f/u with primary care provider to review thyroid studies.   Marye Eagen L. Harraway-Smith, M.D., FACOGr

## 2019-09-06 ENCOUNTER — Other Ambulatory Visit: Payer: Self-pay

## 2019-09-06 ENCOUNTER — Ambulatory Visit (HOSPITAL_BASED_OUTPATIENT_CLINIC_OR_DEPARTMENT_OTHER)
Admission: RE | Admit: 2019-09-06 | Discharge: 2019-09-06 | Disposition: A | Discharge status: Home / Self Care | Payer: Managed Care, Other (non HMO) | Source: Ambulatory Visit | Attending: Obstetrics & Gynecology | Admitting: Obstetrics & Gynecology

## 2019-09-06 DIAGNOSIS — Z1231 Encounter for screening mammogram for malignant neoplasm of breast: Secondary | ICD-10-CM | POA: Insufficient documentation

## 2019-09-06 DIAGNOSIS — Z01419 Encounter for gynecological examination (general) (routine) without abnormal findings: Secondary | ICD-10-CM

## 2019-12-25 ENCOUNTER — Emergency Department (HOSPITAL_BASED_OUTPATIENT_CLINIC_OR_DEPARTMENT_OTHER): Payer: Managed Care, Other (non HMO)

## 2019-12-25 ENCOUNTER — Other Ambulatory Visit: Payer: Self-pay

## 2019-12-25 ENCOUNTER — Encounter (HOSPITAL_BASED_OUTPATIENT_CLINIC_OR_DEPARTMENT_OTHER): Payer: Self-pay

## 2019-12-25 ENCOUNTER — Emergency Department (HOSPITAL_BASED_OUTPATIENT_CLINIC_OR_DEPARTMENT_OTHER)
Admission: EM | Admit: 2019-12-25 | Discharge: 2019-12-25 | Disposition: A | Discharge status: Home / Self Care | Payer: Managed Care, Other (non HMO) | Attending: Emergency Medicine | Admitting: Emergency Medicine

## 2019-12-25 DIAGNOSIS — R102 Pelvic and perineal pain: Secondary | ICD-10-CM | POA: Diagnosis present

## 2019-12-25 DIAGNOSIS — N83292 Other ovarian cyst, left side: Secondary | ICD-10-CM | POA: Diagnosis not present

## 2019-12-25 DIAGNOSIS — N83202 Unspecified ovarian cyst, left side: Secondary | ICD-10-CM

## 2019-12-25 LAB — URINALYSIS, ROUTINE W REFLEX MICROSCOPIC
Bilirubin Urine: NEGATIVE
Glucose, UA: NEGATIVE mg/dL
Ketones, ur: NEGATIVE mg/dL
Leukocytes,Ua: NEGATIVE
Nitrite: NEGATIVE
Protein, ur: NEGATIVE mg/dL
Specific Gravity, Urine: >1.03 — ABNORMAL HIGH (ref 1.005–1.030)
pH: 6 (ref 5.0–8.0)

## 2019-12-25 LAB — URINALYSIS, MICROSCOPIC (REFLEX): WBC, UA: NONE SEEN WBC/hpf (ref 0–5)

## 2019-12-25 LAB — PREGNANCY, URINE: Preg Test, Ur: NEGATIVE

## 2019-12-25 MED ORDER — IBUPROFEN 800 MG PO TABS: 800.0000 mg | ORAL_TABLET | Freq: Three times a day (TID) | ORAL | 0 refills | Status: AC

## 2019-12-25 MED ORDER — KETOROLAC TROMETHAMINE 60 MG/2ML IM SOLN
60.0000 mg | Freq: Once | INTRAMUSCULAR | Status: AC
  Administered 2019-12-25: 60 mg via INTRAMUSCULAR
  Filled 2019-12-25: qty 2

## 2019-12-25 NOTE — ED Provider Notes (Signed)
[facility] EMERGENCY DEPARTMENT Provider Note   CSN: [id number] Arrival date & time: 12/25/19  1453     History Chief Complaint  Patient presents with  . Pelvic Pain    Cassandra Ellis is a 39 y.o. female.  HPI Patient had fairly rapid onset of severe left lower quadrant pain early afternoon.  Reports it became very painful in her left lower abdomen and then felt some discomfort in her lower back.  She felt fine before this started.  She felt a little nauseated from the pain.  No vomiting.  Patient has had a uterine hysterectomy and bilateral salpingoectomy.  Both ovaries remain.  She reports since her procedure, she has done well and not had any problems with recurrent pain.  No urinary symptoms.  No burning no urgency no blood.  She did not try anything for pain yet.  She contacted her GYN who recommended evaluation in the emergency department.    Past Medical History:  Diagnosis Date  . GERD (gastroesophageal reflux disease)   . Hyperthyroidism   . Thyroid disease     Patient Active Problem List   Diagnosis Date Noted  . Pelvic pain in female 06/21/2017  . Abnormal uterine bleeding (AUB) 06/21/2017  . Fibroids, submucosal 06/21/2017  . Endometrial polyp 06/21/2017  . Post-operative state 06/21/2017  . Generalized headache 03/28/2013  . Nausea and vomiting 03/28/2013  . Hypokalemia 03/28/2013  . Intractable headache 03/28/2013    Past Surgical History:  Procedure Laterality Date  . CESAREAN SECTION  2001  . CYSTOSCOPY  06/21/2017   Procedure: CYSTOSCOPY;  Surgeon: Lavonia Drafts, MD;  Location: [facility];  Service: Gynecology;;  . LAPAROSCOPIC VAGINAL HYSTERECTOMY WITH SALPINGECTOMY Bilateral 06/21/2017   Procedure: LAPAROSCOPIC ASSISTED VAGINAL HYSTERECTOMY WITH BILATERAL SALPINGECTOMY;  Surgeon: Lavonia Drafts, MD;  Location: [facility];  Service: Gynecology;  Laterality: Bilateral;  bilateral saplingectomy   . TUBAL LIGATION  01/27/2001  . WISDOM  TOOTH EXTRACTION       OB History   No obstetric history on file.     No family history on file.  Social History   Tobacco Use  . Smoking status: Never Smoker  . Smokeless tobacco: Never Used  Substance Use Topics  . Alcohol use: Yes    Comment: soc  . Drug use: Not Currently    Types: Marijuana    Comment: last use 06/19/17    Home Medications Prior to Admission medications   Medication Sig Start Date End Date Taking? Authorizing Provider  cephALEXin (KEFLEX) 500 MG capsule Take 1 capsule (500 mg total) by mouth 4 (four) times daily. Patient not taking: Reported on 08/31/2019 02/10/19   Molpus, John, MD  fluconazole (DIFLUCAN) 150 MG tablet Take 1 tablet as needed for vaginal yeast infection.  May repeat in 3 days if symptoms persist. Patient not taking: Reported on 08/31/2019 02/10/19   Molpus, Jenny Reichmann, MD  HYDROcodone-acetaminophen (NORCO) 5-325 MG tablet Take 2 tablets by mouth every 4 (four) hours as needed (for pain). Patient not taking: Reported on 08/31/2019 02/10/19   Molpus, John, MD    Allergies    Milk-related compounds, Tomato, and Watermelon flavor  Review of Systems   Review of Systems 10 Systems reviewed and are negative for acute change except as noted in the HPI.  Physical Exam Updated Vital Signs BP (!) 177/91 (BP Location: Left Arm)   Pulse 99   Temp 98.1 F (36.7 C) (Oral)   Resp 20   Ht 5\' 1"  (1.549 m)  Wt 74.4 kg   LMP 06/03/2017 (Approximate)   SpO2 100%   BMI 30.99 kg/m   Physical Exam Constitutional:      Comments: Patient is alert and clinically well in appearance.  She appears very uncomfortable.  Eyes:     Extraocular Movements: Extraocular movements intact.  Cardiovascular:     Rate and Rhythm: Normal rate and regular rhythm.  Pulmonary:     Effort: Pulmonary effort is normal.     Breath sounds: Normal breath sounds.  Abdominal:     Comments: Abdomen is soft without guarding but severe left lower quadrant pain.  No CVA tenderness.    Musculoskeletal:        General: No swelling or tenderness. Normal range of motion.  Skin:    General: Skin is warm and dry.  Neurological:     General: No focal deficit present.     Mental Status: She is oriented to person, place, and time.     Coordination: Coordination normal.  Psychiatric:        Mood and Affect: Mood normal.     ED Results / Procedures / Treatments   Labs (all labs ordered are listed, but only abnormal results are displayed) Labs Reviewed  URINALYSIS, ROUTINE W REFLEX MICROSCOPIC - Abnormal; Notable for the following components:      Result Value   Specific Gravity, Urine >1.030 (*)    Hgb urine dipstick TRACE (*)    All other components within normal limits  URINALYSIS, MICROSCOPIC (REFLEX) - Abnormal; Notable for the following components:   Bacteria, UA RARE (*)    All other components within normal limits  PREGNANCY, URINE    EKG None  Radiology No results found.  Procedures Procedures (including critical care time)  Medications Ordered in ED Medications - No data to display  ED Course  I have reviewed the triage vital signs and the nursing notes.  Pertinent labs & imaging results that were available during my care of the patient were reviewed by me and considered in my medical decision making (see chart for details).  Clinical Course as of Dec 24 2029  Tue Dec 25, 2019  2029 Pain has resolved.   [MP]    Clinical Course User Index [MP] Charlesetta Shanks, MD   MDM Rules/Calculators/A&P                      Patient is clinically well in appearance.  She is alert and nontoxic.  She had fairly acute onset of left lower quadrant pain.  Ultrasound confirms left ovarian cyst.  Similarly, pain began to improve and resolved after Toradol.  Patient is stable for discharge.  Profen prescription provided. Final Clinical Impression(s) / ED Diagnoses Final diagnoses:  Cyst of left ovary    Rx / DC Orders ED Discharge Orders    None        Charlesetta Shanks, MD 12/25/19 2034

## 2019-12-25 NOTE — ED Notes (Signed)
Patient returned from US.

## 2019-12-25 NOTE — ED Triage Notes (Signed)
Sudden onset of stabbing pelvic pain on the L 1 hr PTA. Pt has had partial hysterectomy of uterus. Denies discharge or bleeding.

## 2020-01-09 ENCOUNTER — Encounter: Payer: Self-pay | Admitting: Obstetrics & Gynecology

## 2020-01-09 ENCOUNTER — Other Ambulatory Visit: Payer: Self-pay

## 2020-01-09 ENCOUNTER — Ambulatory Visit (INDEPENDENT_AMBULATORY_CARE_PROVIDER_SITE_OTHER): Payer: Managed Care, Other (non HMO) | Admitting: Obstetrics & Gynecology

## 2020-01-09 VITALS — BP 152/77 | HR 89 | Ht 61.0 in | Wt 160.0 lb

## 2020-01-09 DIAGNOSIS — N83202 Unspecified ovarian cyst, left side: Secondary | ICD-10-CM

## 2020-01-09 DIAGNOSIS — M25512 Pain in left shoulder: Secondary | ICD-10-CM | POA: Diagnosis not present

## 2020-01-09 DIAGNOSIS — G8929 Other chronic pain: Secondary | ICD-10-CM

## 2020-01-09 NOTE — Patient Instructions (Signed)
Ovarian Cyst     An ovarian cyst is a fluid-filled sac that forms on an ovary. The ovaries are small organs that produce eggs in women. Various types of cysts can form on the ovaries. Some may cause symptoms and require treatment. Most ovarian cysts go away on their own, are not cancerous (are benign), and do not cause problems. Common types of ovarian cysts include:  Functional (follicle) cysts. ? Occur during the menstrual cycle, and usually go away with the next menstrual cycle if you do not get pregnant. ? Usually cause no symptoms.  Endometriomas. ? Are cysts that form from the tissue that lines the uterus (endometrium). ? Are sometimes called "chocolate cysts" because they become filled with blood that turns brown. ? Can cause pain in the lower abdomen during intercourse and during your period.  Cystadenoma cysts. ? Develop from cells on the outside surface of the ovary. ? Can get very large and cause lower abdomen pain and pain with intercourse. ? Can cause severe pain if they twist or break open (rupture).  Dermoid cysts. ? Are sometimes found in both ovaries. ? May contain different kinds of body tissue, such as skin, teeth, hair, or cartilage. ? Usually do not cause symptoms unless they get very big.  Theca lutein cysts. ? Occur when too much of a certain hormone (human chorionic gonadotropin) is produced and overstimulates the ovaries to produce an egg. ? Are most common after having procedures used to assist with the conception of a baby (in vitro fertilization). What are the causes? Ovarian cysts may be caused by:  Ovarian hyperstimulation syndrome. This is a condition that can develop from taking fertility medicines. It causes multiple large ovarian cysts to form.  Polycystic ovarian syndrome (PCOS). This is a common hormonal disorder that can cause ovarian cysts, as well as problems with your period or fertility. What increases the risk? The following factors may  make you more likely to develop ovarian cysts:  Being overweight or obese.  Taking fertility medicines.  Taking certain forms of hormonal birth control.  Smoking. What are the signs or symptoms? Many ovarian cysts do not cause symptoms. If symptoms are present, they may include:  Pelvic pain or pressure.  Pain in the lower abdomen.  Pain during sex.  Abdominal swelling.  Abnormal menstrual periods.  Increasing pain with menstrual periods. How is this diagnosed? These cysts are commonly found during a routine pelvic exam. You may have tests to find out more about the cyst, such as:  Ultrasound.  X-ray of the pelvis.  CT scan.  MRI.  Blood tests. How is this treated? Many ovarian cysts go away on their own without treatment. Your health care provider may want to check your cyst regularly for 2-3 months to see if it changes. If you are in menopause, it is especially important to have your cyst monitored closely because menopausal women have a higher rate of ovarian cancer. When treatment is needed, it may include:  Medicines to help relieve pain.  A procedure to drain the cyst (aspiration).  Surgery to remove the whole cyst.  Hormone treatment or birth control pills. These methods are sometimes used to help dissolve a cyst. Follow these instructions at home:  Take over-the-counter and prescription medicines only as told by your health care provider.  Do not drive or use heavy machinery while taking prescription pain medicine.  Get regular pelvic exams and Pap tests as often as told by your health care provider.    Return to your normal activities as told by your health care provider. Ask your health care provider what activities are safe for you.  Do not use any products that contain nicotine or tobacco, such as cigarettes and e-cigarettes. If you need help quitting, ask your health care provider.  Keep all follow-up visits as told by your health care provider.  This is important. Contact a health care provider if:  Your periods are late, irregular, or painful, or they stop.  You have pelvic pain that does not go away.  You have pressure on your bladder or trouble emptying your bladder completely.  You have pain during sex.  You have any of the following in your abdomen: ? A feeling of fullness. ? Pressure. ? Discomfort. ? Pain that does not go away. ? Swelling.  You feel generally ill.  You become constipated.  You lose your appetite.  You develop severe acne.  You start to have more body hair and facial hair.  You are gaining weight or losing weight without changing your exercise and eating habits.  You think you may be pregnant. Get help right away if:  You have abdominal pain that is severe or gets worse.  You cannot eat or drink without vomiting.  You suddenly develop a fever.  Your menstrual period is much heavier than usual. This information is not intended to replace advice given to you by your health care provider. Make sure you discuss any questions you have with your health care provider. Document Revised: 02/27/2018 Document Reviewed: 05/02/2016 Elsevier Patient Education  2020 Elsevier Inc.  

## 2020-01-09 NOTE — Progress Notes (Signed)
Patient reports some abdominal pain. Patient had ultrasound done through the ED (12-25-19) that she reports having a cyst on her left ovary. Kathrene Alu RN

## 2020-01-09 NOTE — Progress Notes (Signed)
History:  39 y.o. presents  For eval of pelvic pain. She was seen in the ED and dx with a left ovarian cyst. She reports that 2 weeks ago she developed a pain that quickly become severe. She was seen at the [facility] ED and was dx'd with a left ov cyst. She reports that she has used NSAIDS for relief of sx.   The pain is not nearly as severe at it was.    The following portions of the patient's history were reviewed and updated as appropriate: allergies, current medications, past family history, past medical history, past social history, past surgical history and problem list.  Review of Systems:  Pertinent items are noted in HPI.    Objective:  Physical Exam Blood pressure (!) 152/77, pulse 89, height 5\' 1"  (1.549 m), weight 160 lb (72.6 kg), last menstrual period 06/03/2017.  CONSTITUTIONAL: Well-developed, well-nourished female in no acute distress.  HENT:  Normocephalic, atraumatic EYES: Conjunctivae and EOM are normal. No scleral icterus.  NECK: Normal range of motion SKIN: Skin is warm and dry. No rash noted. Not diaphoretic.No pallor. [place]: Alert and oriented to person, place, and time. Normal coordination.  Abd: Soft, nontender and nondistended Pelvic: Normal appearing external genitalia; normal appearing vaginal mucosa. Normal discharge.  No palpable masses. There is mild tenderness in the left adnexa but, no masses noted.   Labs and Imaging US PELVIC COMPLETE W TRANSVAGINAL AND TORSION R/O  Result Date: 12/25/2019 CLINICAL DATA:  Initial evaluation for acute left pelvic pain for 5 hours. History of prior hysterectomy. EXAM: TRANSABDOMINAL AND TRANSVAGINAL ULTRASOUND OF PELVIS DOPPLER ULTRASOUND OF OVARIES TECHNIQUE: Both transabdominal and transvaginal ultrasound examinations of the pelvis were performed. Transabdominal technique was performed for global imaging of the pelvis including uterus, ovaries, adnexal regions, and pelvic cul-de-sac. It was necessary to proceed with  endovaginal exam following the transabdominal exam to visualize the uterus, endometrium, and ovaries. Color and duplex Doppler ultrasound was utilized to evaluate blood flow to the ovaries. COMPARISON:  Prior ultrasound from 03/07/2017. FINDINGS: Uterus Surgically absent.  No abnormality about the vaginal cuff. Endometrium Surgically absent. Right ovary Measurements: 2.2 x 1.0 x 1.0 cm = volume: 1.1 mL. Normal appearance/no adnexal mass. Left ovary Measurements: 4.4 x 3.6 x 4.1 cm = volume: 34 mL. 3.0 x 2.5 x 2.5 cm complex hypoechoic cyst with internal lace-like architecture, most consistent with a hemorrhagic cyst. No internal vascularity or solid component. Pulsed Doppler evaluation of the left ovary demonstrates normal low-resistance arterial and venous waveforms. Right ovary is only visualized transabdominally and is difficult to obtain Doppler waveforms, although demonstrates a normal appearance and morphology on grayscale imaging with no secondary signs to suggest torsion. Other findings No abnormal free fluid. IMPRESSION: 1. 3.0 cm complex left ovarian cyst, most consistent with a hemorrhagic cyst. This is almost certainly benign, with no follow-up imaging recommended. 2. No evidence for ovarian torsion. 3. Prior hysterectomy. Electronically Signed   By: Jeannine Boga M.D.   On: 12/25/2019 20:11    Assessment & Plan:  Left ov cyst- favor hemorrhagic.   NSAIDS prn  F/u in Sept 2021 as prev scheduled or sooner prn  Left should pain from fall in July- rec eval by physical med doc.     Total face-to-face time with patient was 20 min.  Greater than 50% was spent in counseling and coordination of care with the patient.   Mikle Sternberg L. Harraway-Smith, M.D., Cherlynn June

## 2020-01-11 ENCOUNTER — Other Ambulatory Visit: Payer: Self-pay

## 2020-01-11 ENCOUNTER — Ambulatory Visit (INDEPENDENT_AMBULATORY_CARE_PROVIDER_SITE_OTHER): Payer: Managed Care, Other (non HMO) | Admitting: Family Medicine

## 2020-01-11 ENCOUNTER — Encounter: Payer: Self-pay | Admitting: Family Medicine

## 2020-01-11 ENCOUNTER — Ambulatory Visit: Payer: Self-pay

## 2020-01-11 VITALS — BP 167/86 | Ht 61.0 in | Wt 160.0 lb

## 2020-01-11 DIAGNOSIS — G8929 Other chronic pain: Secondary | ICD-10-CM

## 2020-01-11 DIAGNOSIS — M25512 Pain in left shoulder: Secondary | ICD-10-CM | POA: Diagnosis not present

## 2020-01-11 NOTE — Assessment & Plan Note (Addendum)
Had a fall in July and has had pain since that time.  Possible developed some labral tear as to the ongoing pain.  May have some impingement based on her functioning. - provided samples of pennsaid  - counseled on HEP and supportive care - can consider PT, injection or imaging.

## 2020-01-11 NOTE — Progress Notes (Signed)
Cassandra Ellis - 39 y.o. female MRN [id number]  Date of birth: Feb 22, 1981  SUBJECTIVE:  Including CC & ROS.  Chief Complaint  Patient presents with  . Shoulder Pain    left shoulder    Cassandra Ellis is a 39 y.o. female that is presenting with acute on chronic left shoulder pain.  She fell in July and has had pain since that time.  The pain is worse when she lies on the affected side.  Seems to be localized to the shoulder.  No history of similar pain or surgery.  Has not tried anything for the pain.    Review of Systems See HPI   HISTORY: Past Medical, Surgical, Social, and Family History Reviewed & Updated per EMR.   Pertinent Historical Findings include:  Past Medical History:  Diagnosis Date  . GERD (gastroesophageal reflux disease)   . Hyperthyroidism   . Thyroid disease     Past Surgical History:  Procedure Laterality Date  . CESAREAN SECTION  2001  . CYSTOSCOPY  06/21/2017   Procedure: CYSTOSCOPY;  Surgeon: Lavonia Drafts, MD;  Location: [facility];  Service: Gynecology;;  . LAPAROSCOPIC VAGINAL HYSTERECTOMY WITH SALPINGECTOMY Bilateral 06/21/2017   Procedure: LAPAROSCOPIC ASSISTED VAGINAL HYSTERECTOMY WITH BILATERAL SALPINGECTOMY;  Surgeon: Lavonia Drafts, MD;  Location: [facility];  Service: Gynecology;  Laterality: Bilateral;  bilateral saplingectomy   . TUBAL LIGATION  01/27/2001  . WISDOM TOOTH EXTRACTION      Allergies  Allergen Reactions  . Milk-Related Compounds Other (See Comments)    Abd cramping and gas  . Tomato Hives  . Watermelon Flavor Hives    Family History  Problem Relation Age of Onset  . Cancer Paternal Grandfather        prostate     Social History   Socioeconomic History  . Marital status: Single    Spouse name: Not on file  . Number of children: Not on file  . Years of education: Not on file  . Highest education level: Not on file  Occupational History  . Not on file  Tobacco Use  . Smoking status: Never Smoker  .  Smokeless tobacco: Never Used  Substance and Sexual Activity  . Alcohol use: Yes    Comment: soc  . Drug use: Not Currently    Types: Marijuana    Comment: last use 06/19/17  . Sexual activity: Yes    Birth control/protection: Surgical  Other Topics Concern  . Not on file  Social History Narrative  . Not on file   Social Determinants of Health   Financial Resource Strain:   . Difficulty of Paying Living Expenses: Not on file  Food Insecurity:   . Worried About Charity fundraiser in the Last Year: Not on file  . Ran Out of Food in the Last Year: Not on file  Transportation Needs:   . Lack of Transportation (Medical): Not on file  . Lack of Transportation (Non-Medical): Not on file  Physical Activity:   . Days of Exercise per Week: Not on file  . Minutes of Exercise per Session: Not on file  Stress:   . Feeling of Stress : Not on file  Social Connections:   . Frequency of Communication with Friends and Family: Not on file  . Frequency of Social Gatherings with Friends and Family: Not on file  . Attends Religious Services: Not on file  . Active Member of Clubs or Organizations: Not on file  . Attends Archivist Meetings: Not on  file  . Marital Status: Not on file  Intimate Partner Violence:   . Fear of Current or Ex-Partner: Not on file  . Emotionally Abused: Not on file  . Physically Abused: Not on file  . Sexually Abused: Not on file     PHYSICAL EXAM:  VS: BP (!) 167/86   Ht 5\' 1"  (1.549 m)   Wt 160 lb (72.6 kg)   LMP 06/03/2017 (Approximate)   BMI 30.23 kg/m  Physical Exam Gen: NAD, alert, cooperative with exam, well-appearing ENT: normal lips, normal nasal mucosa,  Eye: normal EOM, normal conjunctiva and lids Skin: no rashes, no areas of induration  Neuro: normal tone, normal sensation to touch Psych:  normal insight, alert and oriented MSK:  Left shoulder: Normal internal and external rotation. Some pain with external rotation and  abduction. Some pain with internal rotation. Mild pain with empty can testing. Pain with O'Brien's test. Neurovascularly intact  Limited ultrasound: Left shoulder:  Normal-appearing biceps tendon in long and short axis. Normal subscapularis. Normal-appearing supraspinatus.  There may be a hint of impingement and dynamic testing. Normal-appearing posterior glenohumeral joint. No significant thickening of the axillary view  Summary: Findings possible for impingement  Ultrasound and interpretation by Clearance Coots, MD    ASSESSMENT & PLAN:   Chronic left shoulder pain Had a fall in July and has had pain since that time.  Possible developed some labral tear as to the ongoing pain.  May have some impingement based on her functioning. - provided samples of pennsaid  - counseled on HEP and supportive care - can consider PT, injection or imaging.

## 2020-01-11 NOTE — Patient Instructions (Signed)
Nice to meet you Please try ice  Please try the exercises  Please try the pennsaid. I can send in more if you want   Please send me a message in MyChart with any questions or updates.  Please see [place] back in 4 weeks.   --Dr. Raeford Razor

## 2020-01-11 NOTE — Progress Notes (Signed)
Medication Samples have been provided to the patient.  Drug name: Pennsaid       Strength: 2%        Qty: 2 Boxes  [id number]  Exp.Date: 03/2020  Dosing instructions: Use a pea size amount and rub gently.  The patient has been instructed regarding the correct time, dose, and frequency of taking this medication, including desired effects and most common side effects.   Sherrie George, [place] 11:47 AM 01/11/2020

## 2020-04-07 ENCOUNTER — Other Ambulatory Visit: Payer: Self-pay

## 2020-04-07 ENCOUNTER — Encounter: Payer: Self-pay | Admitting: Obstetrics & Gynecology

## 2020-04-07 ENCOUNTER — Other Ambulatory Visit (HOSPITAL_BASED_OUTPATIENT_CLINIC_OR_DEPARTMENT_OTHER): Payer: Managed Care, Other (non HMO)

## 2020-04-07 ENCOUNTER — Ambulatory Visit (INDEPENDENT_AMBULATORY_CARE_PROVIDER_SITE_OTHER): Payer: Managed Care, Other (non HMO) | Admitting: Obstetrics & Gynecology

## 2020-04-07 VITALS — BP 149/72 | HR 88 | Ht 61.0 in | Wt 166.0 lb

## 2020-04-07 DIAGNOSIS — R102 Pelvic and perineal pain: Secondary | ICD-10-CM

## 2020-04-07 DIAGNOSIS — N83202 Unspecified ovarian cyst, left side: Secondary | ICD-10-CM | POA: Diagnosis not present

## 2020-04-07 NOTE — Progress Notes (Signed)
History:  39 y.o. Pt is s/p hyst. She has a h/o of left ov cyst. Pt reports that 2 weeks prev she had severe pain that came and led her to have emesis. She vomited in a bunch of pills that she was working on at work. The pain persisted for several days but, is now absent. She reports that this is the same pain as her prev sx with the cyst. She is concerned about the persistent nature of the pain/cyst.  Pt switched jobs last year and recently met a gentleman from her job 11 years her senior. She is about to get married. She reports that he is a [place] and loves her son.      The following portions of the patient's history were reviewed and updated as appropriate: allergies, current medications, past family history, past medical history, past social history, past surgical history and problem list.  Review of Systems:  Pertinent items are noted in HPI.    Objective:  Physical Exam Blood pressure (!) 149/72, pulse 88, height 5' 1"  (1.549 m), weight 166 lb (75.3 kg), last menstrual period 06/03/2017. Gen: NAD Abd: Soft, nontender and nondistended Pelvic: Normal appearing external genitalia; normal appearing vaginal mucosa and cervix.  Normal discharge.  Small uterus, no other palpable masses, no uterine or adnexal tenderness  Labs and Imaging 12/25/2019 CLINICAL DATA:  Initial evaluation for acute left pelvic pain for 5 hours. History of prior hysterectomy.  EXAM: TRANSABDOMINAL AND TRANSVAGINAL ULTRASOUND OF PELVIS  DOPPLER ULTRASOUND OF OVARIES  TECHNIQUE: Both transabdominal and transvaginal ultrasound examinations of the pelvis were performed. Transabdominal technique was performed for global imaging of the pelvis including uterus, ovaries, adnexal regions, and pelvic cul-de-sac.  It was necessary to proceed with endovaginal exam following the transabdominal exam to visualize the uterus, endometrium, and ovaries. Color and duplex Doppler ultrasound was utilized to evaluate  blood flow to the ovaries.  COMPARISON:  Prior ultrasound from 03/07/2017.  FINDINGS: Uterus  Surgically absent.  No abnormality about the vaginal cuff.  Endometrium  Surgically absent.  Right ovary  Measurements: 2.2 x 1.0 x 1.0 cm = volume: 1.1 mL. Normal appearance/no adnexal mass.  Left ovary  Measurements: 4.4 x 3.6 x 4.1 cm = volume: 34 mL. 3.0 x 2.5 x 2.5 cm complex hypoechoic cyst with internal lace-like architecture, most consistent with a hemorrhagic cyst. No internal vascularity or solid component.  Pulsed Doppler evaluation of the left ovary demonstrates normal low-resistance arterial and venous waveforms. Right ovary is only visualized transabdominally and is difficult to obtain Doppler waveforms, although demonstrates a normal appearance and morphology on grayscale imaging with no secondary signs to suggest torsion.  Other findings  No abnormal free fluid.  IMPRESSION: 1. 3.0 cm complex left ovarian cyst, most consistent with a hemorrhagic cyst. This is almost certainly benign, with no follow-up imaging recommended. 2. No evidence for ovarian torsion. 3. Prior hysterectomy.   Assessment & Plan:  Pelvic pain. H/o left ovarian cyst- on prev [place] thought to be a hemorrhagic cyst. No f/u done from jan 2021 [place] doe to recommendation of radiology.   Repeat Pelvic US  If cyst persists. Pt would like to proceed to resection  Plan pending [place] results.   Total face-to-face time with patient was 18 min.  Greater than 50% was spent in counseling and coordination of care with the patient.   Leaner Morici L. Harraway-Smith, M.D., Cherlynn June

## 2020-04-09 ENCOUNTER — Other Ambulatory Visit: Payer: Self-pay

## 2020-04-09 ENCOUNTER — Ambulatory Visit (HOSPITAL_BASED_OUTPATIENT_CLINIC_OR_DEPARTMENT_OTHER)
Admission: RE | Admit: 2020-04-09 | Discharge: 2020-04-09 | Disposition: A | Discharge status: Home / Self Care | Payer: Managed Care, Other (non HMO) | Source: Ambulatory Visit | Attending: Obstetrics & Gynecology | Admitting: Obstetrics & Gynecology

## 2020-04-09 DIAGNOSIS — N83202 Unspecified ovarian cyst, left side: Secondary | ICD-10-CM | POA: Insufficient documentation

## 2020-04-21 ENCOUNTER — Ambulatory Visit (INDEPENDENT_AMBULATORY_CARE_PROVIDER_SITE_OTHER): Payer: Managed Care, Other (non HMO) | Admitting: Obstetrics & Gynecology

## 2020-04-21 ENCOUNTER — Encounter: Payer: Self-pay | Admitting: Obstetrics & Gynecology

## 2020-04-21 ENCOUNTER — Other Ambulatory Visit: Payer: Self-pay

## 2020-04-21 VITALS — BP 158/77 | HR 105 | Wt 167.0 lb

## 2020-04-21 DIAGNOSIS — N83209 Unspecified ovarian cyst, unspecified side: Secondary | ICD-10-CM

## 2020-04-21 DIAGNOSIS — R102 Pelvic and perineal pain: Secondary | ICD-10-CM

## 2020-04-21 DIAGNOSIS — N83202 Unspecified ovarian cyst, left side: Secondary | ICD-10-CM

## 2020-04-21 NOTE — Progress Notes (Signed)
History:  39 y.o. here today for f/u of Left ov cyst. 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 The pain is now minimal. She reports some discomfort with intercourse but, that is it. Her main concern is prevention of recurrence.   The following portions of the patient's history were reviewed and updated as appropriate: allergies, current medications, past family history, past medical history, past social history, past surgical history and problem list.  Review of Systems:  Pertinent items are noted in HPI.    Objective:  Physical Exam Blood pressure (!) 158/77, pulse (!) 105, weight 167 lb (75.8 kg), last menstrual period 06/03/2017.  CONSTITUTIONAL: Well-developed, well-nourished female in no acute distress.  HENT:  Normocephalic, atraumatic EYES: Conjunctivae and EOM are normal. No scleral icterus.  NECK: Normal range of motion SKIN: Skin is warm and dry. No rash noted. Not diaphoretic.No pallor. [place]: Alert and oriented to person, place, and time. Normal coordination.  Abd: Soft, nontender and nondistended Pelvic: deferred  Labs and Imaging US PELVIS TRANSVAGINAL NON-OB (TV ONLY)  Result Date: 04/09/2020 CLINICAL DATA:  LEFT ovarian cyst EXAM: ULTRASOUND PELVIS TRANSVAGINAL TECHNIQUE: Transvaginal ultrasound examination of the pelvis was performed including evaluation of the uterus, ovaries, adnexal regions, and pelvic cul-de-sac. COMPARISON:  12/25/2019 FINDINGS: Uterus Surgically absent Endometrium N/A Right ovary Not visualized, likely obscured by bowel Left ovary Measurements: 3.4 x 2.7 x 2.8 cm = volume: 13.5 mL. Hemorrhagic cyst seen on the previous exam now appears simple in character and measures 1.9 x 1.4 x 1.9 cm. Scattered follicles without additional masses. Other findings: No free pelvic fluid or adnexal masses otherwise seen. IMPRESSION: Post hysterectomy with nonvisualization of RIGHT  ovary. Resolution of hemorrhagic cyst seen on the previous exam with a simple cyst 1.9 cm diameter now identified at this site. Electronically Signed   By: Lavonia Dana M.D.   On: 04/09/2020 20:20    Assessment & Plan:  Pelvic pain. Left ovarian cyst resolved. I have discussed with pt the natural histroy of functional ovarian cysts.   F/u in 2 months for annual with PAP   F/u prn if pain returns.  All questions answered    Total face-to-face time with patient was 17 min.  Greater than 50% was spent in counseling and coordination of care with the patient.     Ashe Graybeal L. Harraway-Smith, M.D., [place]nn June

## 2020-04-23 ENCOUNTER — Ambulatory Visit: Payer: Managed Care, Other (non HMO) | Attending: Internal Medicine

## 2020-04-23 DIAGNOSIS — Z23 Encounter for immunization: Secondary | ICD-10-CM

## 2020-04-23 NOTE — Progress Notes (Signed)
   Covid-19 Vaccination Clinic  Name:  Cassandra Ellis    MRN: [id number] DOB: 02-Dec-1981  04/23/2020  [name] was observed post Covid-19 immunization for 15 minutes without incident. She was provided with Vaccine Information Sheet and instruction to access the V-Safe system.   [name] was instructed to call 911 with any severe reactions post vaccine: [place] Difficulty breathing  . Swelling of face and throat  . A fast heartbeat  . A bad rash all over body  . Dizziness and weakness   Immunizations Administered    Name Date Dose VIS Date Route   Pfizer COVID-19 Vaccine 04/23/2020 12:12 PM 0.3 mL 02/06/2019 Intramuscular   Manufacturer: [place]   Lot: [id number]   [place]: [id number]

## 2020-05-19 ENCOUNTER — Ambulatory Visit: Payer: Managed Care, Other (non HMO) | Attending: Internal Medicine

## 2020-05-19 DIAGNOSIS — Z23 Encounter for immunization: Secondary | ICD-10-CM

## 2020-05-19 NOTE — Progress Notes (Signed)
   Covid-19 Vaccination Clinic  Name:  Saydee Zolman    MRN: [id number] DOB: 16-Oct-1981  05/19/2020  Ms. Stoneberg was observed post Covid-19 immunization for 15 minutes without incident. She was provided with Vaccine Information Sheet and instruction to access the V-Safe system.   Ms. Haglund was instructed to call 911 with any severe reactions post vaccine: [place] Difficulty breathing  . Swelling of face and throat  . A fast heartbeat  . A bad rash all over body  . Dizziness and weakness   Immunizations Administered    Name Date Dose VIS Date Route   Pfizer COVID-19 Vaccine 05/19/2020  8:17 AM 0.3 mL 02/06/2019 Intramuscular   Manufacturer: [employer], [employer]   Lot: [id number]   [place]: [id number]

## 2021-03-23 IMAGING — MG MM DIGITAL SCREENING BILAT W/ TOMO W/ CAD
8 series · 8 of 24 positions shown · non-contrast
Comparison: None.

CLINICAL DATA: Screening.

EXAM:
DIGITAL SCREENING BILATERAL MAMMOGRAM WITH TOMO AND CAD

[L CC synth-2D]
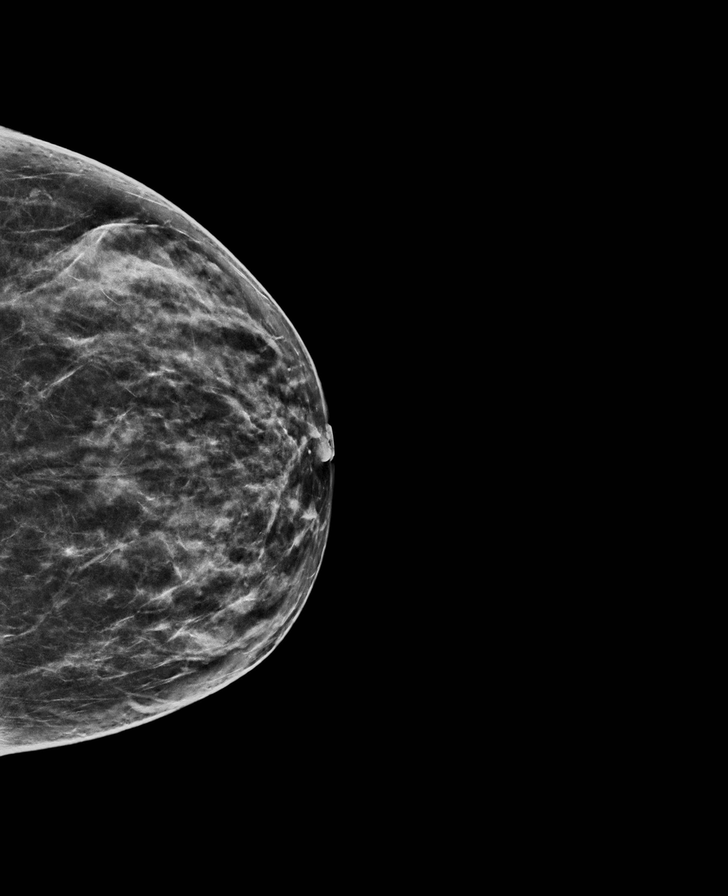

[L MLO synth-2D]
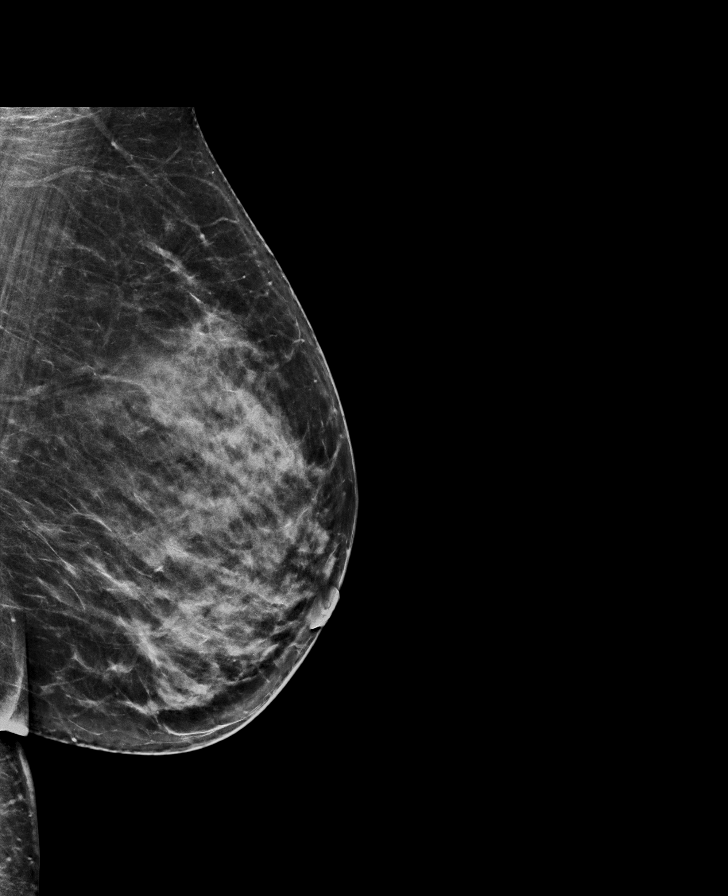

[R MLO synth-2D]
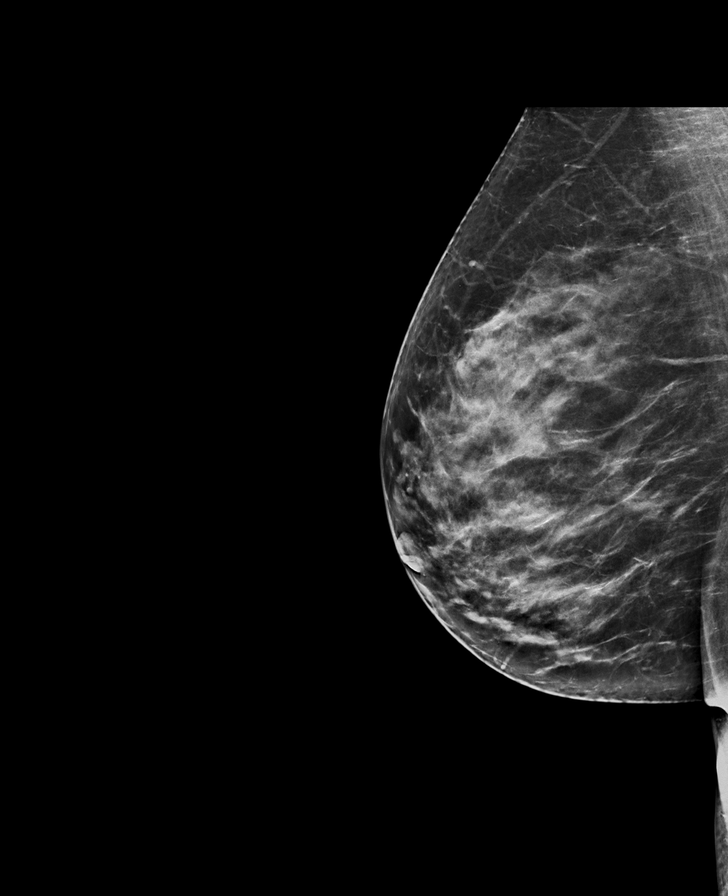

[R CC synth-2D]
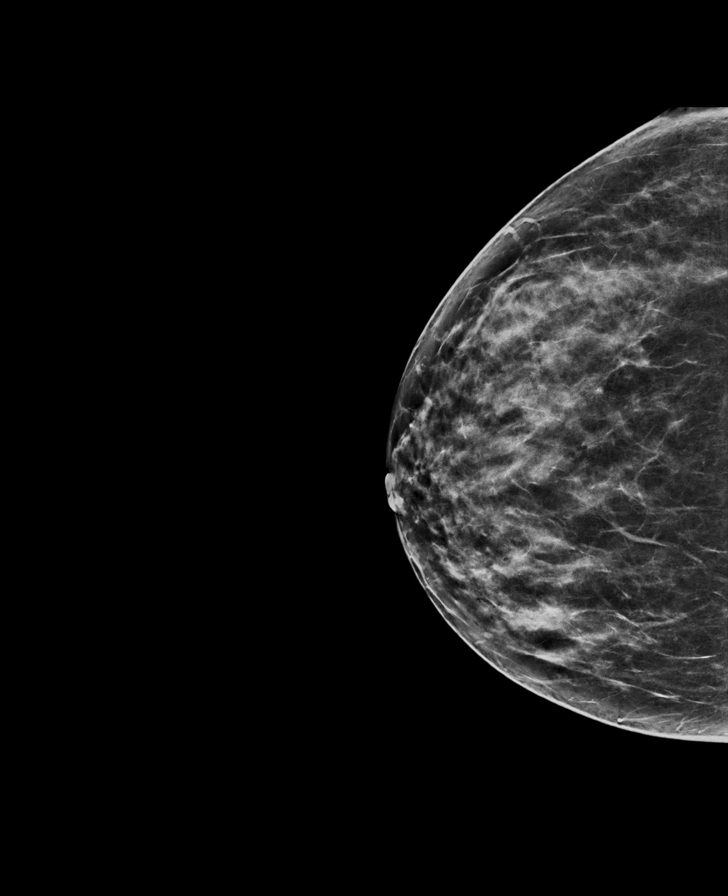

[R MLO tomo · tomo slice 37/73.0]
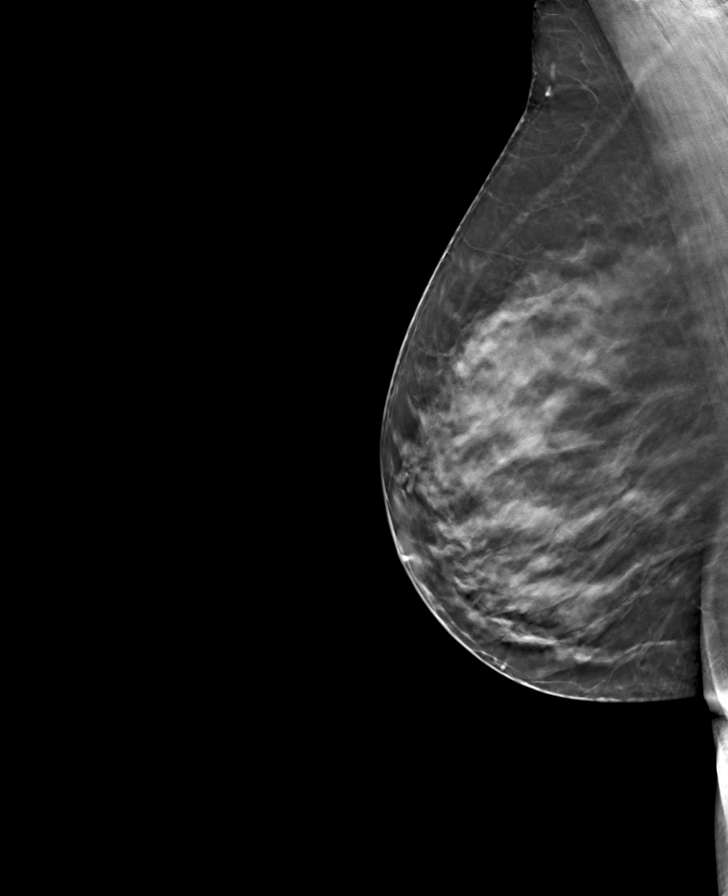

[L CC tomo · tomo slice 35/70.0]
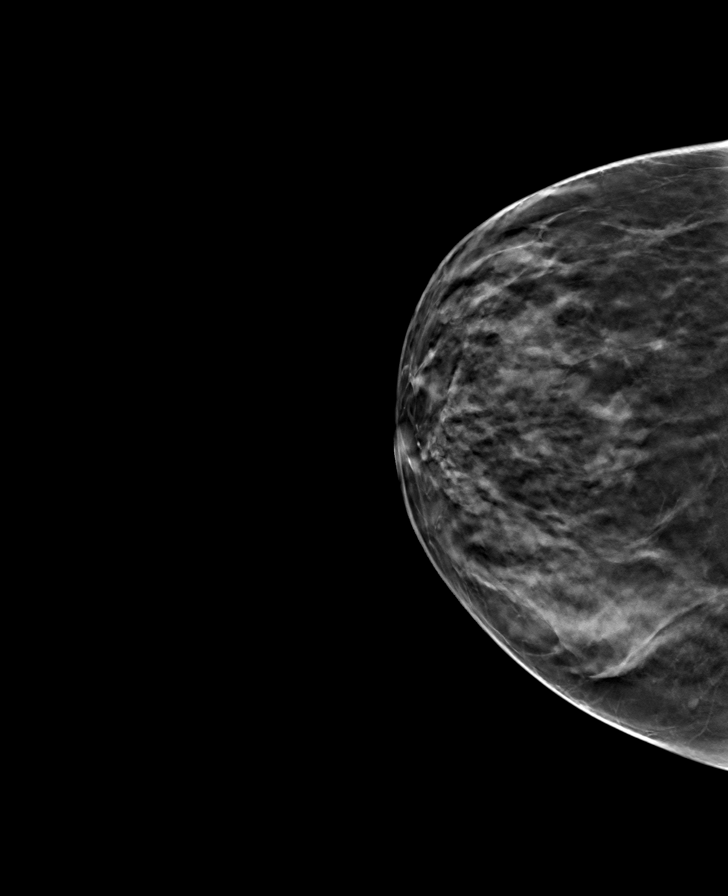

[L MLO tomo · tomo slice 37/72.0]
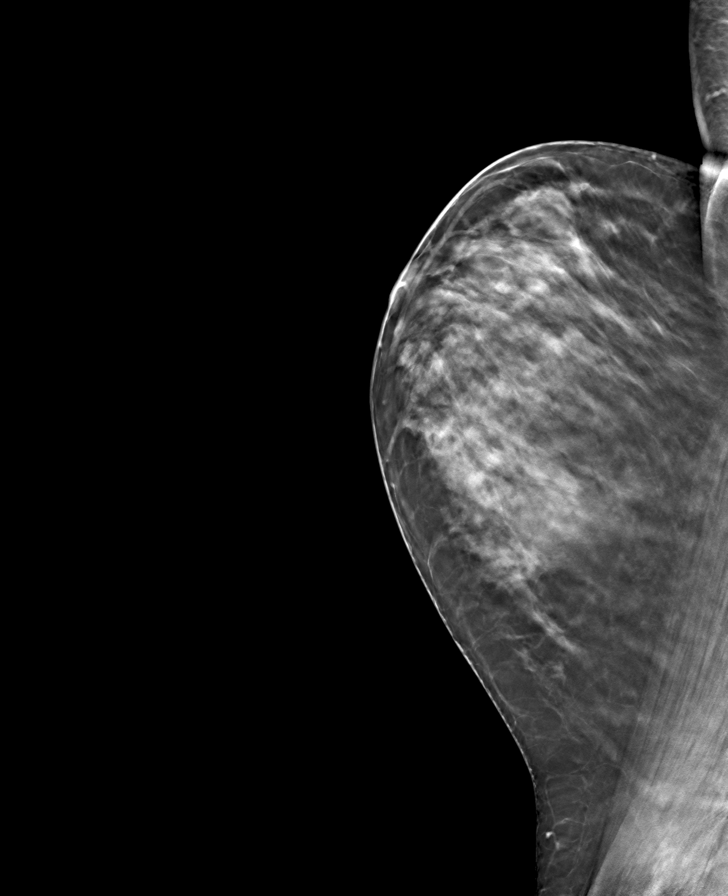

[R CC tomo · tomo slice 35/69.0]
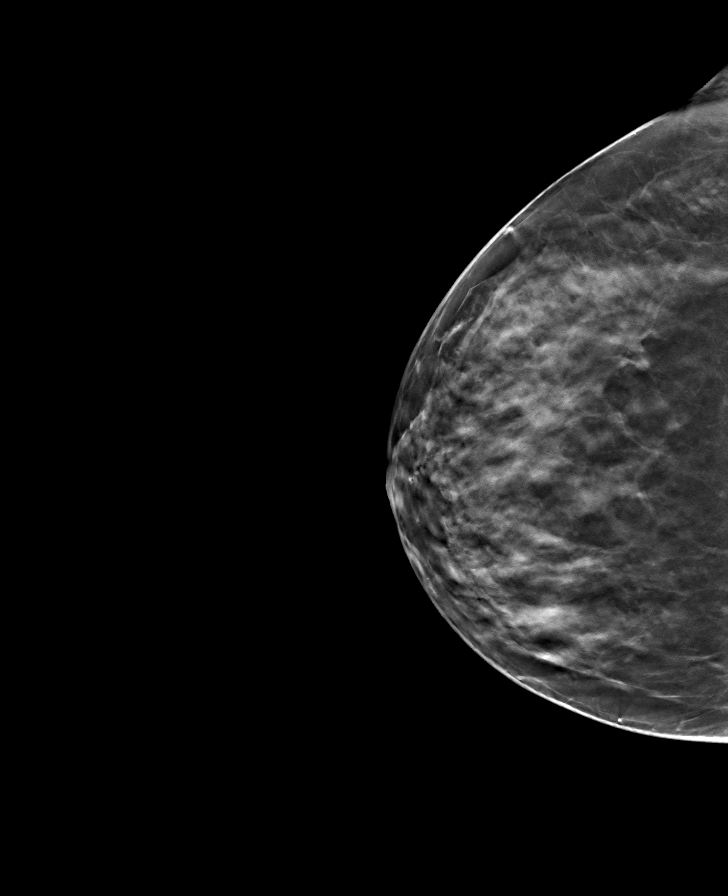

[8 of 24 positions shown; findings below may reference images not displayed]

ACR Breast Density Category c: The breast tissue is heterogeneously
dense, which may obscure small masses
FINDINGS: There are no findings suspicious for malignancy. Images were
processed with CAD.
IMPRESSION: No mammographic evidence of malignancy. A result letter of this
screening mammogram will be mailed directly to the patient.

RECOMMENDATION:
Screening mammogram in one year. (Code:EM-2-IHY)

BI-RADS CATEGORY  1: Negative.

## 2021-10-25 IMAGING — US US TRANSVAGINAL NON-OB
1 series · 14 of 20 positions shown · non-contrast
Comparison: 12/25/2019

CLINICAL DATA: LEFT ovarian cyst

EXAM:
ULTRASOUND PELVIS TRANSVAGINAL
TECHNIQUE: Transvaginal ultrasound examination of the pelvis was performed
including evaluation of the uterus, ovaries, adnexal regions, and
pelvic cul-de-sac.

[Series 1: us transvaginal non-ob · 14 of 20 slices shown]
[im 1/20]
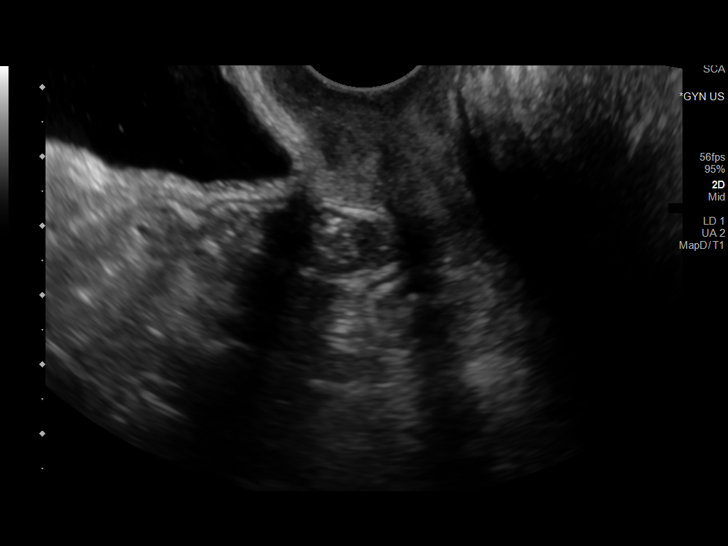
[im 3/20]
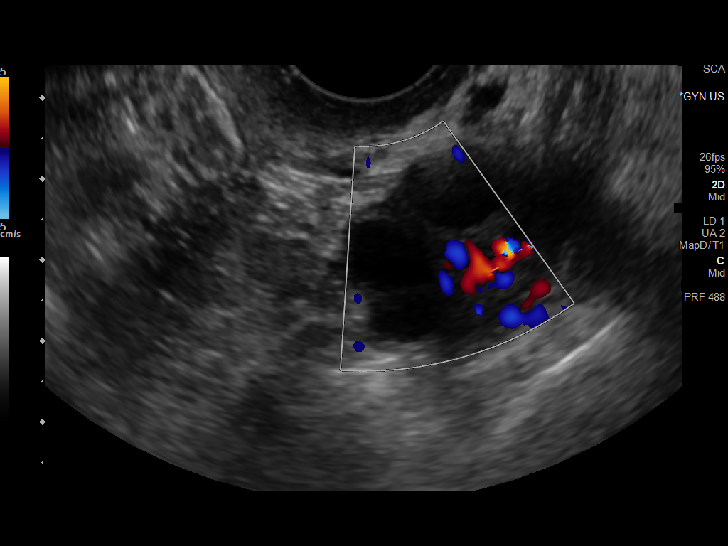
[im 4/20]
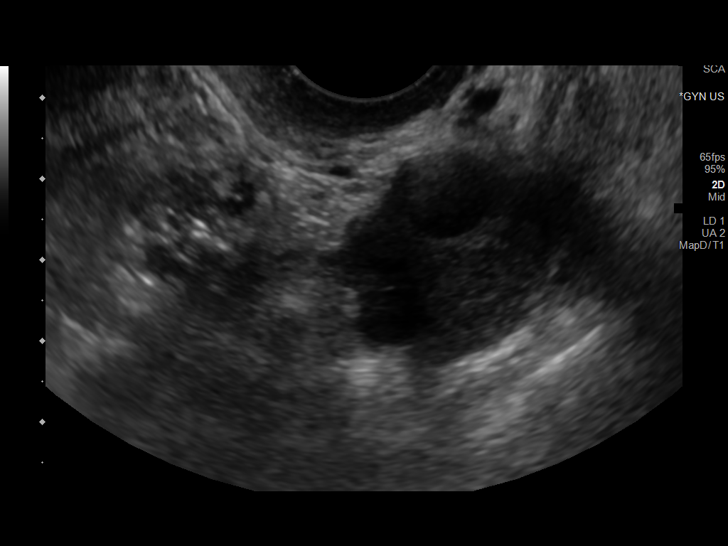
[im 6/20]
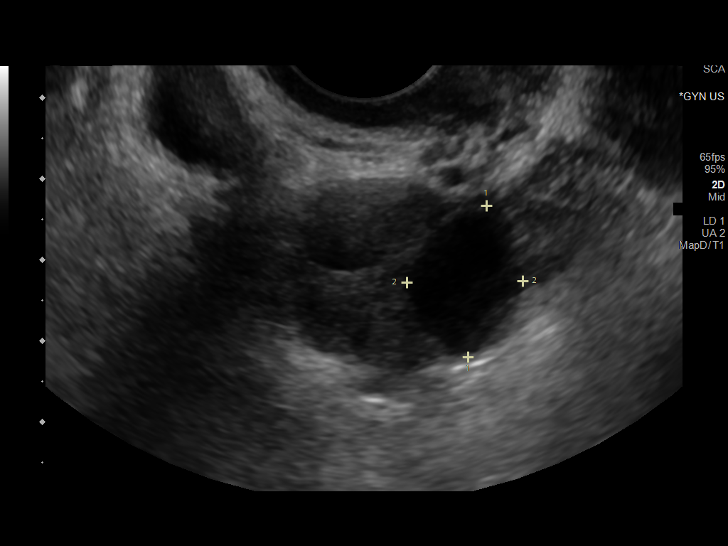
[im 7/20]
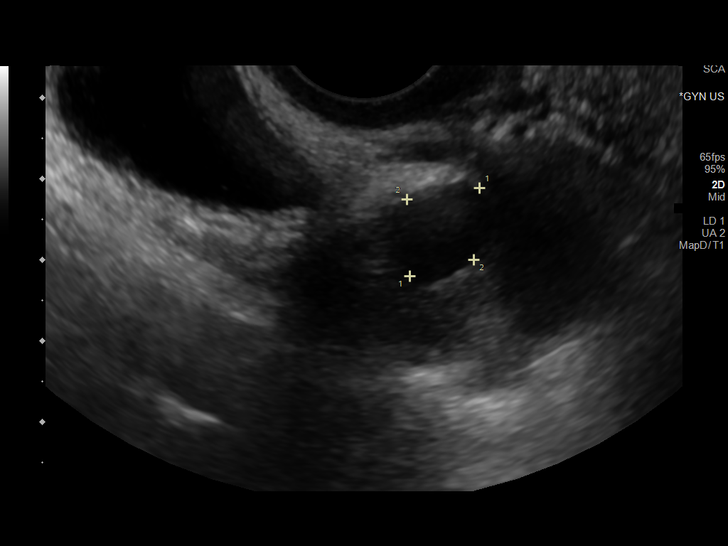
[im 8/20]
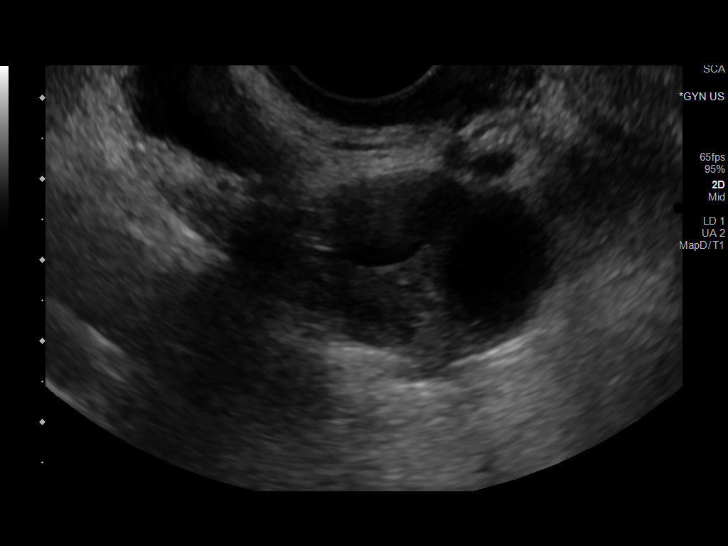
[im 10/20]
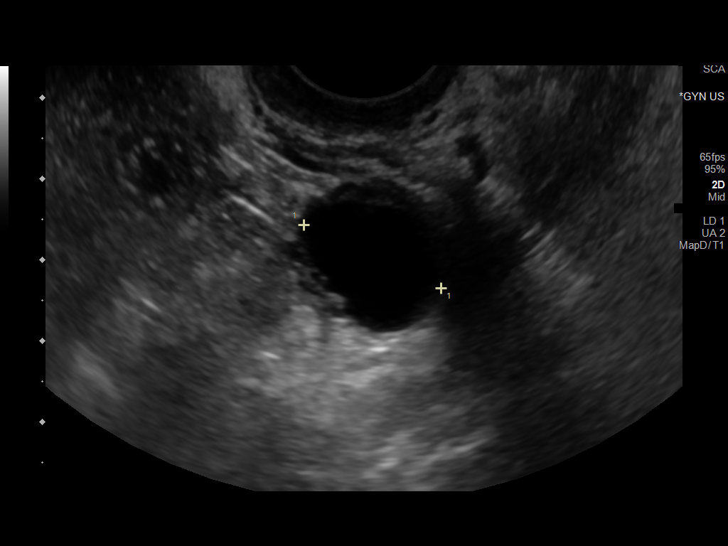
[im 11/20]
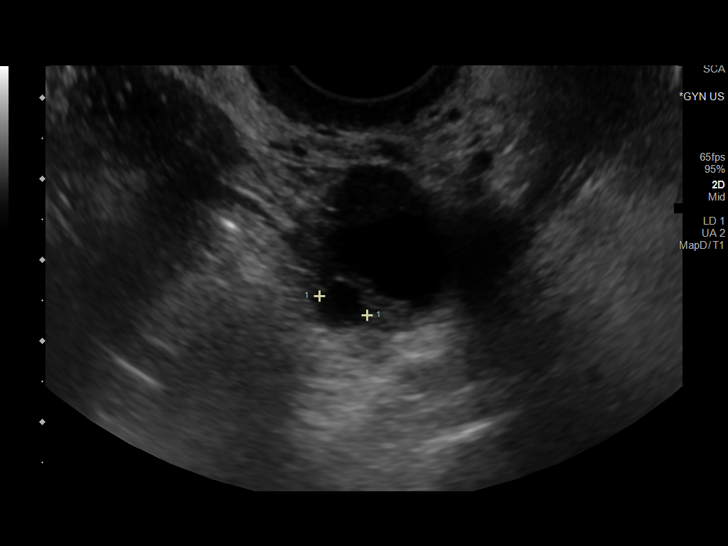
[im 13/20]
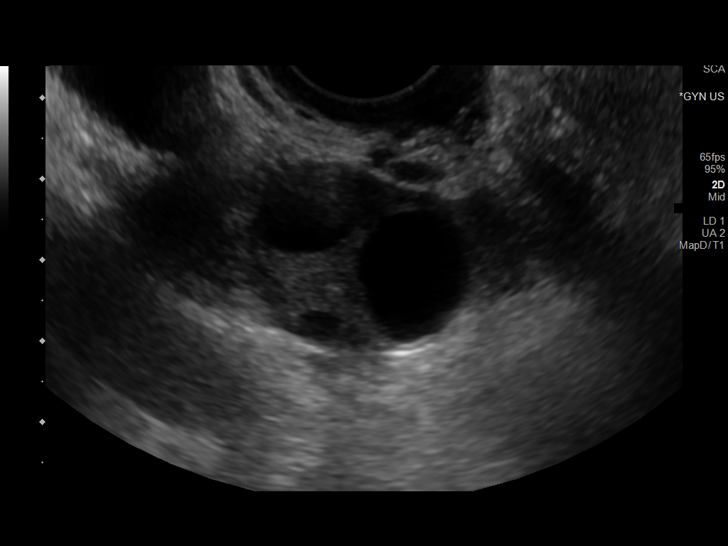
[im 14/20]
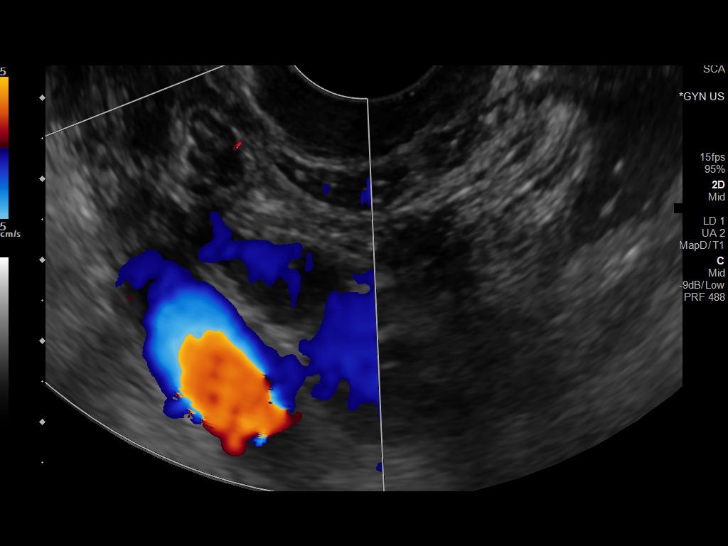
[im 16/20]
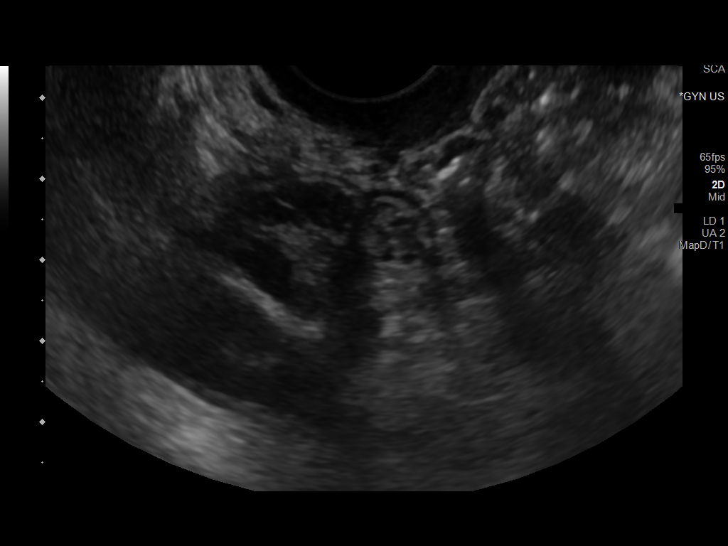
[im 17/20]
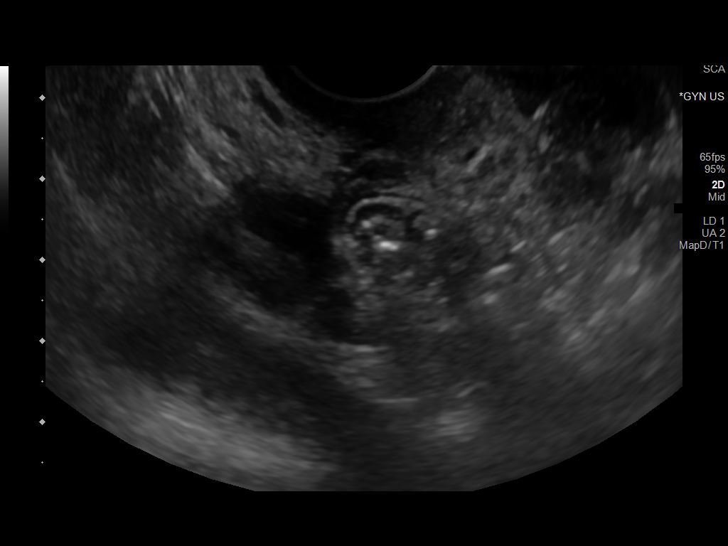
[im 18/20]
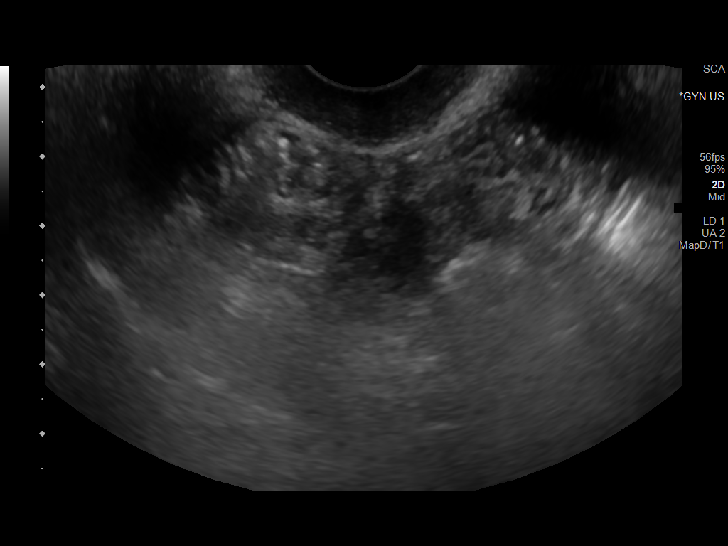
[im 20/20]
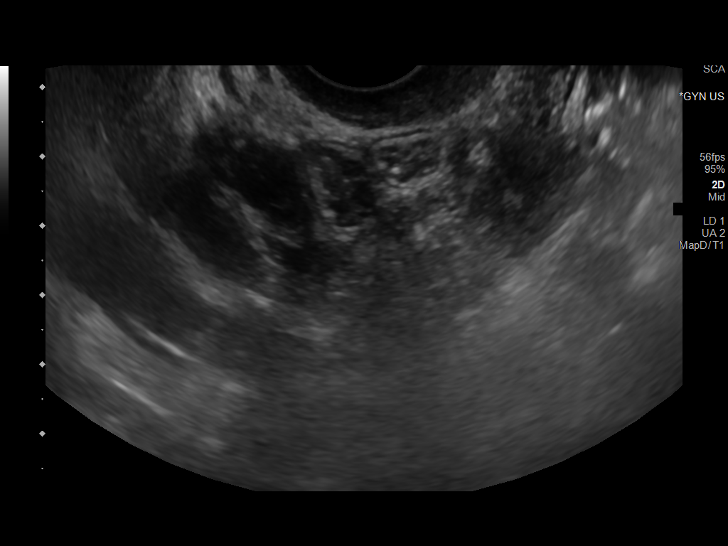

[14 of 20 positions shown; findings below may reference images not displayed]

FINDINGS: Uterus

Surgically absent

Endometrium

N/A

Right ovary

Not visualized, likely obscured by bowel

Left ovary

Measurements: 3.4 x 2.7 x 2.8 cm = volume: 13.5 mL. Hemorrhagic cyst
seen on the previous exam now appears simple in character and
measures 1.9 x 1.4 x 1.9 cm. Scattered follicles without additional
masses.

Other findings: No free pelvic fluid or adnexal masses otherwise
seen.
IMPRESSION: Post hysterectomy with nonvisualization of RIGHT ovary.

Resolution of hemorrhagic cyst seen on the previous exam with a
simple cyst 1.9 cm diameter now identified at this site.

## 2023-03-31 ENCOUNTER — Encounter: Payer: Self-pay | Admitting: *Deleted
